# Patient Record
Sex: Female | Born: 1945 | Race: White | Hispanic: No | Smoking: Never smoker
Health system: Southern US, Community
[De-identification: ages and names within clinical notes are randomized; demographics above are authoritative.]

## PROBLEM LIST (undated history)

## (undated) DIAGNOSIS — I10 Essential (primary) hypertension: Secondary | ICD-10-CM

## (undated) HISTORY — PX: CATARACT EXTRACTION, BILATERAL: SHX1313

## (undated) HISTORY — PX: CHOLECYSTECTOMY: SHX55

## (undated) HISTORY — PX: TONSILLECTOMY: SUR1361

## (undated) HISTORY — PX: ROTATOR CUFF REPAIR: SHX139

---

## 2001-01-17 ENCOUNTER — Other Ambulatory Visit: Admission: RE | Admit: 2001-01-17 | Discharge: 2001-01-17 | Payer: Self-pay | Admitting: *Deleted

## 2001-10-27 ENCOUNTER — Ambulatory Visit (HOSPITAL_COMMUNITY): Admission: RE | Admit: 2001-10-27 | Discharge: 2001-10-27 | Payer: Self-pay | Admitting: *Deleted

## 2002-01-25 ENCOUNTER — Other Ambulatory Visit: Admission: RE | Admit: 2002-01-25 | Discharge: 2002-01-25 | Payer: Self-pay | Admitting: *Deleted

## 2007-08-23 ENCOUNTER — Ambulatory Visit: Payer: Self-pay | Admitting: Family Medicine

## 2010-07-09 ENCOUNTER — Ambulatory Visit: Payer: Self-pay | Admitting: Specialist

## 2010-07-23 ENCOUNTER — Ambulatory Visit: Payer: Self-pay | Admitting: Specialist

## 2010-07-30 ENCOUNTER — Ambulatory Visit: Payer: Self-pay | Admitting: Specialist

## 2010-11-05 ENCOUNTER — Ambulatory Visit: Payer: Self-pay | Admitting: Family Medicine

## 2011-03-26 ENCOUNTER — Ambulatory Visit: Payer: Self-pay | Admitting: Otolaryngology

## 2015-09-23 ENCOUNTER — Other Ambulatory Visit: Payer: Self-pay

## 2015-09-23 ENCOUNTER — Telehealth: Payer: Self-pay | Admitting: Gastroenterology

## 2015-09-23 NOTE — Telephone Encounter (Signed)
-----   Message from Armandina Gemma sent at 09/17/2015  1:59 PM EDT ----- Colon triage

## 2015-09-23 NOTE — Telephone Encounter (Addendum)
Gastroenterology Pre-Procedure Review  Request Date: 10-20 Requesting Physician: Dr.   PATIENT REVIEW QUESTIONS: The patient responded to the following health history questions as indicated:    1. Are you having any GI issues? no 2. Do you have a personal history of Polyps? yes ( ) 3. Do you have a family history of Colon Cancer or Polyps? no 4. Diabetes Mellitus? no 5. Joint replacements in the past 12 months?no 6. Major health problems in the past 3 months?no 7. Any artificial heart valves, MVP, or defibrillator?no    MEDICATIONS & ALLERGIES:    Patient reports the following regarding taking any anticoagulation/antiplatelet therapy:   Plavix, Coumadin, Eliquis, Xarelto, Lovenox, Pradaxa, Brilinta, or Effient? no Aspirin? no  Patient confirms/reports the following medications:  Amlodipine Estradiol Progesterone  No current outpatient prescriptions on file.   No current facility-administered medications for this visit.    Patient confirms/reports the following allergies:  Allergies not on file  No orders of the defined types were placed in this encounter.    AUTHORIZATION INFORMATION Primary Insurance: 1D#: Group #:  Secondary Insurance: 1D#: Group #:  SCHEDULE INFORMATION: Date: 10-20 Time: Location:

## 2015-09-24 ENCOUNTER — Telehealth: Payer: Self-pay

## 2015-09-24 NOTE — Telephone Encounter (Signed)
Pt scheduled for a colonoscopy on 10/17/15. Instructs/rx mailed.

## 2015-09-24 NOTE — Telephone Encounter (Signed)
-----   Message from Michelle L Edmonds sent at 09/17/2015  1:59 PM EDT ----- °Colon triage  °

## 2015-10-09 ENCOUNTER — Encounter: Payer: Self-pay | Admitting: *Deleted

## 2015-10-10 ENCOUNTER — Ambulatory Visit
Admission: RE | Admit: 2015-10-10 | Discharge: 2015-10-10 | Disposition: A | Discharge status: Home / Self Care | Payer: Medicare Other | Source: Ambulatory Visit | Attending: Family Medicine | Admitting: Family Medicine

## 2015-10-10 ENCOUNTER — Other Ambulatory Visit: Payer: Self-pay | Admitting: Family Medicine

## 2015-10-10 DIAGNOSIS — R05 Cough: Secondary | ICD-10-CM

## 2015-10-10 DIAGNOSIS — R059 Cough, unspecified: Secondary | ICD-10-CM

## 2015-10-17 ENCOUNTER — Ambulatory Visit: Payer: Medicare Other | Admitting: Anesthesiology

## 2015-10-17 ENCOUNTER — Other Ambulatory Visit: Payer: Self-pay | Admitting: Gastroenterology

## 2015-10-17 ENCOUNTER — Ambulatory Visit
Admission: RE | Admit: 2015-10-17 | Discharge: 2015-10-17 | Disposition: A | Discharge status: Home / Self Care | Payer: Medicare Other | Source: Ambulatory Visit | Attending: Gastroenterology | Admitting: Gastroenterology

## 2015-10-17 ENCOUNTER — Encounter
Admission: RE | Disposition: A | Discharge status: Home / Self Care | Payer: Self-pay | Source: Ambulatory Visit | Attending: Gastroenterology

## 2015-10-17 DIAGNOSIS — I10 Essential (primary) hypertension: Secondary | ICD-10-CM | POA: Diagnosis not present

## 2015-10-17 DIAGNOSIS — Z880 Allergy status to penicillin: Secondary | ICD-10-CM | POA: Diagnosis not present

## 2015-10-17 DIAGNOSIS — K635 Polyp of colon: Secondary | ICD-10-CM | POA: Insufficient documentation

## 2015-10-17 DIAGNOSIS — Z882 Allergy status to sulfonamides status: Secondary | ICD-10-CM | POA: Diagnosis not present

## 2015-10-17 DIAGNOSIS — Z860100 Personal history of colon polyps, unspecified: Secondary | ICD-10-CM | POA: Insufficient documentation

## 2015-10-17 DIAGNOSIS — K640 First degree hemorrhoids: Secondary | ICD-10-CM | POA: Insufficient documentation

## 2015-10-17 DIAGNOSIS — Z1211 Encounter for screening for malignant neoplasm of colon: Secondary | ICD-10-CM | POA: Diagnosis present

## 2015-10-17 DIAGNOSIS — K5730 Diverticulosis of large intestine without perforation or abscess without bleeding: Secondary | ICD-10-CM | POA: Insufficient documentation

## 2015-10-17 HISTORY — PX: COLONOSCOPY WITH PROPOFOL: SHX5780

## 2015-10-17 HISTORY — DX: Essential (primary) hypertension: I10

## 2015-10-17 HISTORY — PX: POLYPECTOMY: SHX5525

## 2015-10-17 SURGERY — COLONOSCOPY WITH PROPOFOL
Anesthesia: Monitor Anesthesia Care | Wound class: Contaminated

## 2015-10-17 MED ORDER — ACETAMINOPHEN 160 MG/5ML PO SOLN: 325.0000 mg | ORAL | Status: DC | PRN

## 2015-10-17 MED ORDER — LIDOCAINE HCL (CARDIAC) 20 MG/ML IV SOLN
INTRAVENOUS | Status: DC | PRN
  Administered 2015-10-17: 30 mg via INTRAVENOUS

## 2015-10-17 MED ORDER — OXYCODONE HCL 5 MG/5ML PO SOLN: 5.0000 mg | Freq: Once | ORAL | Status: DC | PRN

## 2015-10-17 MED ORDER — OXYCODONE HCL 5 MG PO TABS: 5.0000 mg | ORAL_TABLET | Freq: Once | ORAL | Status: DC | PRN

## 2015-10-17 MED ORDER — DEXAMETHASONE SODIUM PHOSPHATE 4 MG/ML IJ SOLN: 8.0000 mg | Freq: Once | INTRAMUSCULAR | Status: DC | PRN

## 2015-10-17 MED ORDER — LACTATED RINGERS IV SOLN
INTRAVENOUS | Status: DC
  Administered 2015-10-17: 08:00:00 via INTRAVENOUS

## 2015-10-17 MED ORDER — FENTANYL CITRATE (PF) 100 MCG/2ML IJ SOLN
25.0000 ug | INTRAMUSCULAR | Status: DC | PRN
Start: 2015-10-17 — End: 2015-10-17

## 2015-10-17 MED ORDER — ACETAMINOPHEN 325 MG PO TABS: 325.0000 mg | ORAL_TABLET | ORAL | Status: DC | PRN

## 2015-10-17 MED ORDER — LACTATED RINGERS IV SOLN: 500.0000 mL | INTRAVENOUS | Status: DC

## 2015-10-17 MED ORDER — PROPOFOL 10 MG/ML IV BOLUS
INTRAVENOUS | Status: DC | PRN
  Administered 2015-10-17 (×2): 50 mg via INTRAVENOUS
  Administered 2015-10-17: 40 mg via INTRAVENOUS
  Administered 2015-10-17: 100 mg via INTRAVENOUS

## 2015-10-17 MED ORDER — STERILE WATER FOR IRRIGATION IR SOLN
Status: DC | PRN
  Administered 2015-10-17: 09:00:00

## 2015-10-17 SURGICAL SUPPLY — 28 items
CANISTER SUCT 1200ML W/VALVE (MISCELLANEOUS) ×4 IMPLANT
FCP ESCP3.2XJMB 240X2.8X (MISCELLANEOUS)
FORCEPS BIOP RAD 4 LRG CAP 4 (CUTTING FORCEPS) ×4 IMPLANT
FORCEPS BIOP RJ4 240 W/NDL (MISCELLANEOUS)
FORCEPS ESCP3.2XJMB 240X2.8X (MISCELLANEOUS) IMPLANT
GOWN CVR UNV OPN BCK APRN NK (MISCELLANEOUS) ×4 IMPLANT
GOWN ISOL THUMB LOOP REG UNIV (MISCELLANEOUS) ×4
HEMOCLIP INSTINCT (CLIP) IMPLANT
INJECTOR VARIJECT VIN23 (MISCELLANEOUS) IMPLANT
KIT CO2 TUBING (TUBING) IMPLANT
KIT DEFENDO VALVE AND CONN (KITS) IMPLANT
KIT ENDO PROCEDURE OLY (KITS) ×4 IMPLANT
LIGATOR MULTIBAND 6SHOOTER MBL (MISCELLANEOUS) IMPLANT
MARKER SPOT ENDO TATTOO 5ML (MISCELLANEOUS) IMPLANT
PAD GROUND ADULT SPLIT (MISCELLANEOUS) IMPLANT
SNARE SHORT THROW 13M SML OVAL (MISCELLANEOUS) IMPLANT
SNARE SHORT THROW 30M LRG OVAL (MISCELLANEOUS) IMPLANT
SPOT EX ENDOSCOPIC TATTOO (MISCELLANEOUS)
SUCTION POLY TRAP 4CHAMBER (MISCELLANEOUS) IMPLANT
TRAP SUCTION POLY (MISCELLANEOUS) IMPLANT
TUBING CONN 6MMX3.1M (TUBING)
TUBING SUCTION CONN 0.25 STRL (TUBING) IMPLANT
UNDERPAD 30X60 958B10 (PK) (MISCELLANEOUS) IMPLANT
VALVE BIOPSY ENDO (VALVE) IMPLANT
VARIJECT INJECTOR VIN23 (MISCELLANEOUS)
WATER AUXILLARY (MISCELLANEOUS) IMPLANT
WATER STERILE IRR 250ML POUR (IV SOLUTION) ×4 IMPLANT
WATER STERILE IRR 500ML POUR (IV SOLUTION) IMPLANT

## 2015-10-17 NOTE — Op Note (Signed)
[facility] Gastroenterology Patient Name: Brianna [name] Procedure Date: 10/17/2015 8:58 AM MRN: [id number] Account #: [id number] Date of Birth: 08/17/1946 Admit Type: Outpatient Age: 69 Room: [facility] OR ROOM 01 Gender: Female Note Status: Finalized Procedure:         Colonoscopy Indications:       Screening for colorectal malignant neoplasm Providers:         Midge Minium, MD Referring MD:      Temple Pacini, MD (Referring MD) Medicines:         Propofol per Anesthesia Complications:     No immediate complications. Procedure:         Pre-Anesthesia Assessment:                    - Prior to the procedure, a History and Physical was                     performed, and patient medications and allergies were                     reviewed. The patient's tolerance of previous anesthesia                     was also reviewed. The risks and benefits of the procedure                     and the sedation options and risks were discussed with the                     patient. All questions were answered, and informed consent                     was obtained. Prior Anticoagulants: The patient has taken                     no previous anticoagulant or antiplatelet agents. ASA                     Grade Assessment: II - A patient with mild systemic                     disease. After reviewing the risks and benefits, the                     patient was deemed in satisfactory condition to undergo                     the procedure.                    After obtaining informed consent, the colonoscope was                     passed under direct vision. Throughout the procedure, the                     patient's blood pressure, pulse, and oxygen saturations                     were monitored continuously. The Olympus CF H180AL                     colonoscope (S#: [id number]) was introduced through the anus  and advanced to the the cecum, identified by appendiceal             orifice and ileocecal valve. The colonoscopy was performed                     without difficulty. The patient tolerated the procedure                     well. The quality of the bowel preparation was excellent. Findings:      The perianal and digital rectal examinations were normal.      A 3 mm polyp was found in the cecum. The polyp was sessile. The polyp       was removed with a cold biopsy forceps. Resection and retrieval were       complete.      Non-bleeding internal hemorrhoids were found during retroflexion. The       hemorrhoids were Grade I (internal hemorrhoids that do not prolapse).      A few small-mouthed diverticula were found in the sigmoid colon. Impression:        - One 3 mm polyp in the cecum. Resected and retrieved.                    - Non-bleeding internal hemorrhoids.                    - Diverticulosis in the sigmoid colon. Recommendation:    - Await pathology results.                    - Repeat colonoscopy in 5 years if polyp adenoma and 10                     years if hyperplastic Procedure Code(s): --- Professional ---                    [phone] [id number], Colonoscopy, flexible; with biopsy, single or                     multiple Diagnosis Code(s): --- Professional ---                    Z12.11, Encounter for screening for malignant neoplasm of                     colon                    D12.0, Benign neoplasm of cecum CPT copyright 2014 American Medical Association. All rights reserved. The codes documented in this report are preliminary and upon coder review may  be revised to meet current compliance requirements. Midge Miniumarren Tamina Cyphers, MD 10/17/2015 9:20:47 AM This report has been signed electronically. Number of Addenda: 0 Note Initiated On: 10/17/2015 8:58 AM Scope Withdrawal Time: 0 hours 6 minutes 56 seconds  Total Procedure Duration: 0 hours 11 minutes 4 seconds       [facility]

## 2015-10-17 NOTE — Anesthesia Preprocedure Evaluation (Signed)
Anesthesia Evaluation  Patient identified by MRN, date of birth, ID band Patient awake    Reviewed: Allergy & Precautions, H&P , NPO status , Patient's Chart, lab work & pertinent test results, reviewed documented beta blocker date and time   Airway Mallampati: II  TM Distance: >3 FB Neck ROM: full    Dental no notable dental hx.    Pulmonary neg pulmonary ROS,    Pulmonary exam normal breath sounds clear to auscultation       Cardiovascular Exercise Tolerance: Good hypertension, negative cardio ROS   Rhythm:regular Rate:Normal     Neuro/Psych negative neurological ROS  negative psych ROS   GI/Hepatic negative GI ROS, Neg liver ROS,   Endo/Other  negative endocrine ROS  Renal/GU negative Renal ROS  negative genitourinary   Musculoskeletal   Abdominal   Peds  Hematology negative hematology ROS (+)   Anesthesia Other Findings   Reproductive/Obstetrics negative OB ROS                             Anesthesia Physical Anesthesia Plan  ASA: II  Anesthesia Plan: MAC   Post-op Pain Management:    Induction:   Airway Management Planned:   Additional Equipment:   Intra-op Plan:   Post-operative Plan:   Informed Consent: I have reviewed the patients History and Physical, chart, labs and discussed the procedure including the risks, benefits and alternatives for the proposed anesthesia with the patient or authorized representative who has indicated his/her understanding and acceptance.     Plan Discussed with: CRNA  Anesthesia Plan Comments:         Anesthesia Quick Evaluation

## 2015-10-17 NOTE — Transfer of Care (Signed)
Immediate Anesthesia Transfer of Care Note  Patient: Brianna [name]  Procedure(s) Performed: Procedure(s) with comments: COLONOSCOPY WITH PROPOFOL (N/A) POLYPECTOMY - cecum polyp  Patient Location: PACU  Anesthesia Type: MAC  Level of Consciousness: awake, alert  and patient cooperative  Airway and Oxygen Therapy: Patient Spontanous Breathing and Patient connected to supplemental oxygen  Post-op Assessment: Post-op Vital signs reviewed, Patient's Cardiovascular Status Stable, Respiratory Function Stable, Patent Airway and No signs of Nausea or vomiting  Post-op Vital Signs: Reviewed and stable  Complications: No apparent anesthesia complications

## 2015-10-17 NOTE — Discharge Instructions (Signed)

## 2015-10-17 NOTE — Anesthesia Procedure Notes (Signed)
Procedure Name: MAC Performed by: Savio Albrecht Pre-anesthesia Checklist: Patient identified, Emergency Drugs available, Suction available, Patient being monitored and Timeout performed Patient Re-evaluated:Patient Re-evaluated prior to inductionOxygen Delivery Method: Nasal cannula       

## 2015-10-17 NOTE — Anesthesia Postprocedure Evaluation (Signed)
  Anesthesia Post-op Note  Patient: Brianna CraneFrances D Seat  Procedure(s) Performed: Procedure(s) with comments: COLONOSCOPY WITH PROPOFOL (N/A) POLYPECTOMY - cecum polyp  Anesthesia type:MAC  Patient location: PACU  Post pain: Pain level controlled  Post assessment: Post-op Vital signs reviewed, Patient's Cardiovascular Status Stable, Respiratory Function Stable, Patent Airway and No signs of Nausea or vomiting  Post vital signs: Reviewed and stable  Last Vitals:  Filed Vitals:   10/17/15 0922  BP:   Pulse: 68  Temp: 36.5 C  Resp: 21    Level of consciousness: awake, alert  and patient cooperative  Complications: No apparent anesthesia complications

## 2015-10-17 NOTE — H&P (Signed)
  [facility]  [address] [place], [place] [address] Phone: [phone] Fax : [phone]  Primary Care Physician:  Dortha KernBLISS, LAURA K, MD Primary Gastroenterologist:  Dr. Servando SnareWohl  Pre-Procedure History & Physical: HPI:  Brianna [name] is a 69 y.o. female is here for an colonoscopy.   Past Medical History  Diagnosis Date  . Hypertension     Past Surgical History  Procedure Laterality Date  . Tonsillectomy    . Cholecystectomy    . Rotator cuff repair Right     Prior to Admission medications   Medication Sig Start Date End Date Taking? Authorizing Provider  amLODipine (NORVASC) 5 MG tablet Take 5 mg by mouth daily. AM   Yes Historical Provider, MD  estradiol (ESTRACE) 0.5 MG tablet Take 0.5 mg by mouth daily. AM   Yes Historical Provider, MD  progesterone (PROMETRIUM) 100 MG capsule Take 100 mg by mouth daily. AM   Yes Historical Provider, MD    Allergies as of 09/23/2015  . (Not on File)    History reviewed. No pertinent family history.  Social History   Social History  . Marital Status: Widowed    Spouse Name: N/A  . Number of Children: N/A  . Years of Education: N/A   Occupational History  . Not on file.   Social History Main Topics  . Smoking status: Never Smoker   . Smokeless tobacco: Not on file  . Alcohol Use: No  . Drug Use: Not on file  . Sexual Activity: Not on file   Other Topics Concern  . Not on file   Social History Narrative  . No narrative on file    Review of Systems: See HPI, otherwise negative ROS  Physical Exam: Ht 5\' 4"  (1.626 m)  Wt 174 lb (78.926 kg)  BMI 29.85 kg/m2 General:   Alert,  pleasant and cooperative in NAD Head:  Normocephalic and atraumatic. Neck:  Supple; no masses or thyromegaly. Lungs:  Clear throughout to auscultation.    Heart:  Regular rate and rhythm. Abdomen:  Soft, nontender and nondistended. Normal bowel sounds, without guarding, and without rebound.   Neurologic:  Alert and   oriented x4;  grossly normal neurologically.  Impression/Plan: Brianna [name] is here for an colonoscopy to be performed for history of colon polyps  Risks, benefits, limitations, and alternatives regarding  colonoscopy have been reviewed with the patient.  Questions have been answered.  All parties agreeable.   Darlina Rumpfaren Romolo Sieling, MD  10/17/2015, 7:59 AM

## 2015-10-18 ENCOUNTER — Encounter: Payer: Self-pay | Admitting: Gastroenterology

## 2015-10-29 ENCOUNTER — Encounter: Payer: Self-pay | Admitting: Gastroenterology

## 2015-12-06 DIAGNOSIS — M7540 Impingement syndrome of unspecified shoulder: Secondary | ICD-10-CM | POA: Insufficient documentation

## 2015-12-25 ENCOUNTER — Other Ambulatory Visit: Payer: Self-pay | Admitting: Specialist

## 2015-12-25 DIAGNOSIS — M7542 Impingement syndrome of left shoulder: Secondary | ICD-10-CM

## 2016-01-14 ENCOUNTER — Ambulatory Visit
Admission: RE | Admit: 2016-01-14 | Discharge: 2016-01-14 | Disposition: A | Discharge status: Home / Self Care | Payer: Medicare Other | Source: Ambulatory Visit | Attending: Specialist | Admitting: Specialist

## 2016-01-14 DIAGNOSIS — M75122 Complete rotator cuff tear or rupture of left shoulder, not specified as traumatic: Secondary | ICD-10-CM | POA: Insufficient documentation

## 2016-01-14 DIAGNOSIS — M7542 Impingement syndrome of left shoulder: Secondary | ICD-10-CM | POA: Diagnosis not present

## 2016-01-14 DIAGNOSIS — M67814 Other specified disorders of tendon, left shoulder: Secondary | ICD-10-CM | POA: Diagnosis not present

## 2016-01-16 DIAGNOSIS — M75120 Complete rotator cuff tear or rupture of unspecified shoulder, not specified as traumatic: Secondary | ICD-10-CM | POA: Insufficient documentation

## 2016-02-06 ENCOUNTER — Other Ambulatory Visit: Payer: Medicare Other

## 2016-02-12 ENCOUNTER — Ambulatory Visit: Admission: RE | Admit: 2016-02-12 | Payer: Medicare Other | Source: Ambulatory Visit | Admitting: Specialist

## 2016-02-12 ENCOUNTER — Encounter: Admission: RE | Payer: Self-pay | Source: Ambulatory Visit

## 2016-02-12 SURGERY — ARTHROSCOPY, SHOULDER WITH REPAIR, ROTATOR CUFF, OPEN
Anesthesia: Choice | Laterality: Left

## 2016-02-20 ENCOUNTER — Other Ambulatory Visit: Payer: Self-pay | Admitting: Specialist

## 2016-03-05 ENCOUNTER — Other Ambulatory Visit: Payer: Self-pay | Admitting: Specialist

## 2016-03-09 ENCOUNTER — Encounter
Admission: RE | Admit: 2016-03-09 | Discharge: 2016-03-09 | Disposition: A | Discharge status: Home / Self Care | Payer: Medicare Other | Source: Ambulatory Visit | Attending: Specialist | Admitting: Specialist

## 2016-03-09 DIAGNOSIS — Z01812 Encounter for preprocedural laboratory examination: Secondary | ICD-10-CM | POA: Insufficient documentation

## 2016-03-09 DIAGNOSIS — Z01810 Encounter for preprocedural cardiovascular examination: Secondary | ICD-10-CM | POA: Insufficient documentation

## 2016-03-09 LAB — CBC
HEMATOCRIT: 41.8 % (ref 35.0–47.0)
HEMOGLOBIN: 13.9 g/dL (ref 12.0–16.0)
MCH: 29.5 pg (ref 26.0–34.0)
MCHC: 33.1 g/dL (ref 32.0–36.0)
MCV: 88.9 fL (ref 80.0–100.0)
Platelets: 207 10*3/uL (ref 150–440)
RBC: 4.7 MIL/uL (ref 3.80–5.20)
RDW: 14.9 % — AB (ref 11.5–14.5)
WBC: 7.1 10*3/uL (ref 3.6–11.0)

## 2016-03-09 NOTE — Patient Instructions (Addendum)
Your procedure is scheduled on: Wednesday 03/18/16 Report to Day Surgery. 2ND FLOOR MEDICAL MALL ENTRANCE To find out your arrival time please call [phone] between 1PM - 3PM on Tuesday 03/17/16.  Remember: Instructions that are not followed completely may result in serious medical risk, up to and including death, or upon the discretion of your surgeon and anesthesiologist your surgery may need to be rescheduled.    __X__ 1. Do not eat food or drink liquids after midnight. No gum chewing or hard candies.     __X__ 2. No Alcohol for 24 hours before or after surgery.   ____ 3. Bring all medications with you on the day of surgery if instructed.    __X__ 4. Notify your doctor if there is any change in your medical condition     (cold, fever, infections).     Do not wear jewelry, make-up, hairpins, clips or nail polish.  Do not wear lotions, powders, or perfumes.   Do not shave 48 hours prior to surgery. Men may shave face and neck.  Do not bring valuables to the hospital.    [facility] is not responsible for any belongings or valuables.               Contacts, dentures or bridgework may not be worn into surgery.  Leave your suitcase in the car. After surgery it may be brought to your room.  For patients admitted to the hospital, discharge time is determined by your                treatment team.   Patients discharged the day of surgery will not be allowed to drive home.   Please read over the following fact sheets that you were given:   Surgical Site Infection Prevention   __X__ Take these medicines the morning of surgery with A SIP OF WATER:    1. AMLODIPINE  2.   3.   4.  5.  6.  ____ Fleet Enema (as directed)   __X__ Use CHG Soap as directed  ____ Use inhalers on the day of surgery  ____ Stop metformin 2 days prior to surgery    ____ Take 1/2 of usual insulin dose the night before surgery and none on the morning of surgery.   ____ Stop Coumadin/Plavix/aspirin on    ____ Stop Anti-inflammatories on    ____ Stop supplements until after surgery.    ____ Bring C-Pap to the hospital.   Peripheral Nerve Blocks Your caregiver has placed a nerve block in one of your arms or legs to reduce pain and discomfort. The block lessens the amount of pain medicine you will need. Your caregiver will inject you in the arm or leg that was operated on. The injection is usually given away from the surgical site. The injection is a local anesthetic or a combination of local anesthetics. This injection provides numbing pain relief for up to 18 to 24 hours. There are few possible complications from this procedure. However, you should notify your caregiver if you have any problems. Be aware that you may lose feeling at and around the surgical area. If numbness happens, take proper measures to avoid injury. Do not stand up unassisted if you have a nerve block in your leg. Do not try to lift items if you have had a nerve block in your arm. Be careful when placing hot or cold items on a numb extremity. SEEK IMMEDIATE MEDICAL CARE IF:  You have redness, swelling, pain,  or discharge at the injection site.  You develop dizziness or lightheadedness.  You have blurred vision.  There is a ringing or buzzing in your ears.  You have a metal taste in your mouth.  You develop numbness or tingling around your mouth.  You develop drowsiness.  You develop confusion.   This information is not intended to replace advice given to you by your health care provider. Make sure you discuss any questions you have with your health care provider.   Document Released: 03/22/2008 Document Revised: 03/07/2012 Document Reviewed: 02/02/2011 Elsevier Interactive Patient Education [employer]2016 Elsevier Inc.

## 2016-03-18 ENCOUNTER — Ambulatory Visit
Admission: RE | Admit: 2016-03-18 | Discharge: 2016-03-18 | Disposition: A | Discharge status: Home / Self Care | Payer: Medicare Other | Source: Ambulatory Visit | Attending: Specialist | Admitting: Specialist

## 2016-03-18 ENCOUNTER — Ambulatory Visit: Payer: Medicare Other | Admitting: Anesthesiology

## 2016-03-18 ENCOUNTER — Encounter
Admission: RE | Disposition: A | Discharge status: Home / Self Care | Payer: Self-pay | Source: Ambulatory Visit | Attending: Specialist

## 2016-03-18 ENCOUNTER — Encounter: Payer: Self-pay | Admitting: Specialist

## 2016-03-18 DIAGNOSIS — M7542 Impingement syndrome of left shoulder: Secondary | ICD-10-CM | POA: Insufficient documentation

## 2016-03-18 DIAGNOSIS — S43422A Sprain of left rotator cuff capsule, initial encounter: Secondary | ICD-10-CM

## 2016-03-18 DIAGNOSIS — M75122 Complete rotator cuff tear or rupture of left shoulder, not specified as traumatic: Secondary | ICD-10-CM | POA: Insufficient documentation

## 2016-03-18 HISTORY — PX: SHOULDER ARTHROSCOPY WITH OPEN ROTATOR CUFF REPAIR: SHX6092

## 2016-03-18 SURGERY — ARTHROSCOPY, SHOULDER WITH REPAIR, ROTATOR CUFF, OPEN
Anesthesia: Regional | Site: Shoulder | Laterality: Left | Wound class: Clean

## 2016-03-18 MED ORDER — BUPIVACAINE-EPINEPHRINE (PF) 0.25% -1:200000 IJ SOLN
INTRAMUSCULAR | Status: DC | PRN
  Administered 2016-03-18: 25 mL via PERINEURAL
  Administered 2016-03-18: 30 mL via PERINEURAL

## 2016-03-18 MED ORDER — ONDANSETRON HCL 4 MG/2ML IJ SOLN
INTRAMUSCULAR | Status: DC | PRN
  Administered 2016-03-18: 4 mg via INTRAVENOUS

## 2016-03-18 MED ORDER — CEFAZOLIN SODIUM-DEXTROSE 2-4 GM/100ML-% IV SOLN
2.0000 g | Freq: Once | INTRAVENOUS | Status: DC
  Filled 2016-03-18: qty 100

## 2016-03-18 MED ORDER — SODIUM CHLORIDE 0.9 % IV SOLN
10000.0000 ug | INTRAVENOUS | Status: DC | PRN
  Administered 2016-03-18 (×3): 50 ug via INTRAVENOUS

## 2016-03-18 MED ORDER — FENTANYL CITRATE (PF) 100 MCG/2ML IJ SOLN: 25.0000 ug | INTRAMUSCULAR | Status: DC | PRN

## 2016-03-18 MED ORDER — ROPIVACAINE HCL 5 MG/ML IJ SOLN
INTRAMUSCULAR | Status: AC
  Filled 2016-03-18: qty 40

## 2016-03-18 MED ORDER — MELOXICAM 15 MG PO TABS: 15.0000 mg | ORAL_TABLET | Freq: Every day | ORAL | Status: DC

## 2016-03-18 MED ORDER — GLYCOPYRROLATE 0.2 MG/ML IJ SOLN
INTRAMUSCULAR | Status: DC | PRN
  Administered 2016-03-18: 0.6 mg via INTRAVENOUS

## 2016-03-18 MED ORDER — ONDANSETRON HCL 4 MG/2ML IJ SOLN: 4.0000 mg | Freq: Once | INTRAMUSCULAR | Status: DC | PRN

## 2016-03-18 MED ORDER — SUCCINYLCHOLINE CHLORIDE 20 MG/ML IJ SOLN
INTRAMUSCULAR | Status: DC | PRN
  Administered 2016-03-18: 100 mg via INTRAVENOUS

## 2016-03-18 MED ORDER — CEFAZOLIN SODIUM-DEXTROSE 2-3 GM-% IV SOLR
INTRAVENOUS | Status: DC | PRN
  Administered 2016-03-18: 2 g via INTRAVENOUS

## 2016-03-18 MED ORDER — LIDOCAINE HCL (CARDIAC) 20 MG/ML IV SOLN
INTRAVENOUS | Status: DC | PRN
  Administered 2016-03-18: 60 mg via INTRAVENOUS

## 2016-03-18 MED ORDER — MORPHINE SULFATE (PF) 4 MG/ML IV SOLN
INTRAVENOUS | Status: DC | PRN
  Administered 2016-03-18: 4 mg via INTRAVENOUS

## 2016-03-18 MED ORDER — MIDAZOLAM HCL 5 MG/5ML IJ SOLN
2.0000 mg | Freq: Once | INTRAMUSCULAR | Status: AC
  Administered 2016-03-18: 2 mg via INTRAVENOUS

## 2016-03-18 MED ORDER — LIDOCAINE HCL (PF) 1 % IJ SOLN
INTRAMUSCULAR | Status: AC
  Filled 2016-03-18: qty 5

## 2016-03-18 MED ORDER — CEFAZOLIN SODIUM 1 G IJ SOLR
2.0000 g | INTRAMUSCULAR | Status: DC
  Filled 2016-03-18: qty 20

## 2016-03-18 MED ORDER — MELOXICAM 7.5 MG PO TABS
15.0000 mg | ORAL_TABLET | Freq: Every day | ORAL | Status: DC
  Administered 2016-03-18: 15 mg via ORAL

## 2016-03-18 MED ORDER — OXYCODONE-ACETAMINOPHEN 10-325 MG PO TABS: 1.0000 | ORAL_TABLET | Freq: Four times a day (QID) | ORAL | Status: DC | PRN

## 2016-03-18 MED ORDER — LACTATED RINGERS IV SOLN
INTRAVENOUS | Status: DC
  Administered 2016-03-18 (×2): via INTRAVENOUS

## 2016-03-18 MED ORDER — ROCURONIUM BROMIDE 100 MG/10ML IV SOLN
INTRAVENOUS | Status: DC | PRN
  Administered 2016-03-18: 50 mg via INTRAVENOUS

## 2016-03-18 MED ORDER — METHOCARBAMOL 500 MG PO TABS: 500.0000 mg | ORAL_TABLET | Freq: Four times a day (QID) | ORAL | Status: DC

## 2016-03-18 MED ORDER — GABAPENTIN 400 MG PO CAPS
400.0000 mg | ORAL_CAPSULE | Freq: Once | ORAL | Status: AC
  Administered 2016-03-18: 400 mg via ORAL

## 2016-03-18 MED ORDER — GABAPENTIN 400 MG PO CAPS: 400.0000 mg | ORAL_CAPSULE | Freq: Three times a day (TID) | ORAL | Status: DC

## 2016-03-18 MED ORDER — PROPOFOL 10 MG/ML IV BOLUS
INTRAVENOUS | Status: DC | PRN
  Administered 2016-03-18: 140 mg via INTRAVENOUS

## 2016-03-18 MED ORDER — FENTANYL CITRATE (PF) 100 MCG/2ML IJ SOLN
INTRAMUSCULAR | Status: AC
  Administered 2016-03-18: 50 ug via INTRAVENOUS
  Filled 2016-03-18: qty 2

## 2016-03-18 MED ORDER — EPHEDRINE SULFATE 50 MG/ML IJ SOLN
INTRAMUSCULAR | Status: DC | PRN
  Administered 2016-03-18: 10 mg via INTRAVENOUS
  Administered 2016-03-18: 5 mg via INTRAVENOUS
  Administered 2016-03-18: 20 mg via INTRAVENOUS
  Administered 2016-03-18: 5 mg via INTRAVENOUS

## 2016-03-18 MED ORDER — EPINEPHRINE HCL 1 MG/ML IJ SOLN
INTRAMUSCULAR | Status: AC
  Filled 2016-03-18: qty 1

## 2016-03-18 MED ORDER — CLINDAMYCIN PHOSPHATE 600 MG/50ML IV SOLN
600.0000 mg | Freq: Three times a day (TID) | INTRAVENOUS | Status: DC
  Administered 2016-03-18: 600 mg via INTRAVENOUS

## 2016-03-18 MED ORDER — MIDAZOLAM HCL 5 MG/5ML IJ SOLN
INTRAMUSCULAR | Status: AC
  Administered 2016-03-18: 2 mg via INTRAVENOUS
  Filled 2016-03-18: qty 5

## 2016-03-18 MED ORDER — EPINEPHRINE HCL 1 MG/ML IJ SOLN
INTRAMUSCULAR | Status: DC | PRN
  Administered 2016-03-18: 9 mL

## 2016-03-18 MED ORDER — NEOSTIGMINE METHYLSULFATE 10 MG/10ML IV SOLN
INTRAVENOUS | Status: DC | PRN
  Administered 2016-03-18: 3 mg via INTRAVENOUS

## 2016-03-18 MED ORDER — BUPIVACAINE-EPINEPHRINE (PF) 0.25% -1:200000 IJ SOLN
INTRAMUSCULAR | Status: AC
  Filled 2016-03-18: qty 60

## 2016-03-18 MED ORDER — CEFAZOLIN SODIUM 1 G IJ SOLR
Freq: Once | INTRAMUSCULAR | Status: DC
  Filled 2016-03-18: qty 50

## 2016-03-18 MED ORDER — FAMOTIDINE 20 MG PO TABS
20.0000 mg | ORAL_TABLET | Freq: Once | ORAL | Status: AC
  Administered 2016-03-18: 20 mg via ORAL

## 2016-03-18 MED ORDER — NEOMYCIN-POLYMYXIN B GU 40-200000 IR SOLN
Status: AC
  Filled 2016-03-18: qty 2

## 2016-03-18 MED ORDER — FENTANYL CITRATE (PF) 100 MCG/2ML IJ SOLN
50.0000 ug | Freq: Once | INTRAMUSCULAR | Status: AC
  Administered 2016-03-18: 50 ug via INTRAVENOUS

## 2016-03-18 SURGICAL SUPPLY — 63 items
ADAPTER IRRIG TUBE 2 SPIKE SOL (ADAPTER) ×6 IMPLANT
BLADE AGGRESSIVE PLUS 4.0 (BLADE) ×6 IMPLANT
BLADE SURG SZ11 CARB STEEL (BLADE) ×3 IMPLANT
BUR AGGRESSIVE+ 5.5 (BURR) IMPLANT
BUR BR 5.5 12 FLUTE (BURR) IMPLANT
BUR RADIUS 4.0X18.5 (BURR) IMPLANT
BUR RADIUS 5.5 (BURR) ×3 IMPLANT
CANNULA 5.75X7 CRYSTAL CLEAR (CANNULA) ×3 IMPLANT
CANNULA 8.5X75 THRED (CANNULA) ×3 IMPLANT
CANNULA PARTIAL THREAD 2X7 (CANNULA) ×3 IMPLANT
CHLORAPREP W/TINT 26ML (MISCELLANEOUS) ×3 IMPLANT
CONNECTOR PERFECT PASSER (CONNECTOR) ×3 IMPLANT
COVER MAYO STAND STRL (DRAPES) ×3 IMPLANT
DRAPE IMP U-DRAPE 54X76 (DRAPES) ×3 IMPLANT
DRAPE SHEET LG 3/4 BI-LAMINATE (DRAPES) ×3 IMPLANT
DRAPE STERI 35X30 U-POUCH (DRAPES) ×3 IMPLANT
GAUZE PETRO XEROFOAM 1X8 (MISCELLANEOUS) ×6 IMPLANT
GAUZE SPONGE 4X4 12PLY STRL (GAUZE/BANDAGES/DRESSINGS) ×3 IMPLANT
GLOVE SURG ORTHO 8.0 STRL STRW (GLOVE) ×3 IMPLANT
GOWN STRL REUS W/ TWL LRG LVL4 (GOWN DISPOSABLE) ×1 IMPLANT
GOWN STRL REUS W/TWL LRG LVL4 (GOWN DISPOSABLE) ×2
IV LACTATED RINGER IRRG 3000ML (IV SOLUTION) ×18
IV LR IRRIG 3000ML ARTHROMATIC (IV SOLUTION) ×9 IMPLANT
KIT RM TURNOVER STRD PROC AR (KITS) ×3 IMPLANT
KIT SHOULDER TRACTION (DRAPES) ×3 IMPLANT
MANIFOLD NEPTUNE II (INSTRUMENTS) ×3 IMPLANT
MAT BLUE FLOOR 46X72 FLO (MISCELLANEOUS) ×3 IMPLANT
NDL MAYO CATGUT SZ5 (NEEDLE) ×2
NDL SAFETY 18GX1.5 (NEEDLE) ×3 IMPLANT
NDL SUT 5 .5 CRC TPR PNT MAYO (NEEDLE) ×1 IMPLANT
NEEDLE SPNL 18GX3.5 QUINCKE PK (NEEDLE) ×3 IMPLANT
NS IRRIG 500ML POUR BTL (IV SOLUTION) ×3 IMPLANT
PACK ARTHROSCOPY SHOULDER (MISCELLANEOUS) ×3 IMPLANT
PASSER SUT CAPTURE FIRST (SUTURE) ×6 IMPLANT
SET TUBE SUCT SHAVER OUTFL 24K (TUBING) ×3 IMPLANT
SET TUBE TIP INTRA-ARTICULAR (MISCELLANEOUS) ×3 IMPLANT
SLING ULTRA II LG (MISCELLANEOUS) ×6 IMPLANT
SOL PREP PVP 2OZ (MISCELLANEOUS) ×3
SOLUTION PREP PVP 2OZ (MISCELLANEOUS) ×1 IMPLANT
SPONGE VERSALON 4X4 4PLY (MISCELLANEOUS) ×3 IMPLANT
STAPLER SKIN PROX 35W (STAPLE) ×3 IMPLANT
SUT ETH BLK MONO 3 0 FS 1 12/B (SUTURE) ×3 IMPLANT
SUT ETHIBOND 2-0  6-8 CP-2 (SUTURE) ×2
SUT ETHIBOND 2-0 6-8 CP-2 (SUTURE) ×1 IMPLANT
SUT ETHILON 3 0 FSLX (SUTURE) ×3 IMPLANT
SUT ORTHOCORD W/MULTIPK NDL (SUTURE) ×6 IMPLANT
SUT PDS PLUS 0 (SUTURE) ×2
SUT PDS PLUS AB 0 CT-2 (SUTURE) ×1 IMPLANT
SUT PERFECTPASSER WHITE CART (SUTURE) ×3 IMPLANT
SUT SMART STITCH CARTRIDGE (SUTURE) ×3 IMPLANT
SUT VIC AB 0 CT2 27 (SUTURE) ×3 IMPLANT
SUT VIC AB 2-0 CT2 27 (SUTURE) ×6 IMPLANT
SUT VIC AB 4-0 SH 27 (SUTURE) ×2
SUT VIC AB 4-0 SH 27XANBCTRL (SUTURE) ×1 IMPLANT
SUT VICRYL 3-0 27IN (SUTURE) ×3 IMPLANT
SUTURE MAGNUM WIRE 2X48 BLK (SUTURE) ×9 IMPLANT
SYR 20CC LL (SYRINGE) ×3 IMPLANT
SYR 30ML LL (SYRINGE) ×3 IMPLANT
SYR 50ML LL SCALE MARK (SYRINGE) ×3 IMPLANT
TUBING ARTHRO INFLOW-ONLY STRL (TUBING) ×3 IMPLANT
TUBING CONNECTING 10 (TUBING) ×2 IMPLANT
TUBING CONNECTING 10' (TUBING) ×1
WAND HAND CNTRL MULTIVAC 90 (MISCELLANEOUS) ×9 IMPLANT

## 2016-03-18 NOTE — Op Note (Signed)
03/18/2016  1:24 PM  PATIENT:  [place] CraneFrances D Mehaffey    PRE-OPERATIVE DIAGNOSIS:  [id number] Complete rotatr-cuff tear/ruptr of left shoulder, not trauma. Impingement syncrome.  AC arthrtis  POST-OPERATIVE DIAGNOSIS:  Same  PROCEDURE:  SHOULDER ARTHROSCOPY WITH OPEN ROTATOR CUFF REPAIR.  Subacromial decompression.  Distal clavicle excision  SURGEON:  Valinda HoarMILLER,Hildreth Robart E, MD   .  ANESTHESIA:   General plus interscalene block  PREOPERATIVE INDICATIONS:  [place] CraneFrances D Bullen is a  [age] y.o. female with a diagnosis of M75.122 Complete rotatr-cuff tear/ruptr of left shoulder, not trauma who failed conservative measures and elected for surgical management.    The risks benefits and alternatives were discussed with the patient preoperatively including but not limited to the risks of infection, bleeding, nerve injury, cardiopulmonary complications, the need for revision surgery, among others, and the patient was willing to proceed.  OPERATIVE IMPLANTS: None  OPERATIVE FINDINGS: The patient had severe subacromial bursitis and impingement. The anterior acromion was prominent. The ACjoint was arthritic. The glenohumeral joint was intact.  The biceps tendon was normal.  The rotator cuff was torn from anterior to the biceps tendon directly posteriorly. There was a large amount of tissue remaining laterally.  There was no place to put anchors laterally.    OPERATIVE PROCEDURE: The patient was brought to the operating room where satisfactory general endotracheal and interscalene anesthesia were accomplished.The patient was turned into the lateral decubitus position and the shoulder was prepped and draped in a sterile fashion. Arthroscopy was carried out from a posterior portal with accessory portals laterally and anteriorly. The  joint was examined first. The above findings were encountered. The motorized shaver was introduced anteriorly and the undersurface of the rotator cuff probed and lightly debrided. The biceps  tendon was normal and was left alone.    The labrum was trimmed up with the ArthroCare wand 2. The arthroscope was redirected into the subacromial space. There was severe bursitis which was resected with the motorized shaver and ArthroCare wand. The large bur was introduced from a posterior portal and the anterior acromion was debrided. The undersurface of the clavicle was debrided with the bur which was then reintroduced from an anterior portal and the remaining distal clavicle completely excised. Once debridement was finished, the cuff was repaired side to side with magnum wire and orthocord sutures. The most posterior ones were put in arthroscopically, but a small incision was made to expose the anterior cuff and 2 final sutures were placed under direct vision.  Final debridement was carried out with the motorized shaver and the ArthroCare wand. The joint was flushed and the stab wounds and closed with staples. The incision was closed with 0 vicryl and 2-0 vicryl and staples.  The scope was re-introduced and the repair examined and was felt to be solid.  Sponge and needle counts were correct.   The dry sterile dressing and was applied along with a padded sling. Patient was awakened and taken recovery in good condition. TENS pads were applied.   Tenna ChildHoward E Tylyn Derwin M.D.

## 2016-03-18 NOTE — H&P (Signed)
THE PATIENT WAS SEEN PRIOR TO SURGERY TODAY.  HISTORY, ALLERGIES, HOME MEDICATIONS AND OPERATIVE PROCEDURE WERE REVIEWED. RISKS AND BENEFITS OF SURGERY DISCUSSED WITH PATIENT AGAIN.  NO CHANGES FROM INITIAL HISTORY AND PHYSICAL NOTED.    

## 2016-03-18 NOTE — Anesthesia Preprocedure Evaluation (Signed)
Anesthesia Evaluation  Patient identified by MRN, date of birth, ID band Patient awake    Reviewed: Allergy & Precautions, NPO status , Patient's Chart, lab work & pertinent test results  History of Anesthesia Complications Negative for: history of anesthetic complications  Airway Mallampati: II       Dental   Pulmonary neg pulmonary ROS,           Cardiovascular hypertension, Pt. on medications      Neuro/Psych negative neurological ROS  negative psych ROS   GI/Hepatic negative GI ROS, Neg liver ROS,   Endo/Other  negative endocrine ROS  Renal/GU negative Renal ROS     Musculoskeletal   Abdominal   Peds  Hematology negative hematology ROS (+)   Anesthesia Other Findings   Reproductive/Obstetrics                             Anesthesia Physical Anesthesia Plan  ASA: II  Anesthesia Plan: General and Regional   Post-op Pain Management:    Induction: Intravenous  Airway Management Planned: Oral ETT  Additional Equipment:   Intra-op Plan:   Post-operative Plan:   Informed Consent: I have reviewed the patients History and Physical, chart, labs and discussed the procedure including the risks, benefits and alternatives for the proposed anesthesia with the patient or authorized representative who has indicated his/her understanding and acceptance.     Plan Discussed with:   Anesthesia Plan Comments:         Anesthesia Quick Evaluation

## 2016-03-18 NOTE — Transfer of Care (Signed)
Immediate Anesthesia Transfer of Care Note  Patient: Brianna [name]  Procedure(s) Performed: Procedure(s): SHOULDER ARTHROSCOPY WITH OPEN ROTATOR CUFF REPAIR (Left)  Patient Location: PACU  Anesthesia Type:General  Level of Consciousness: awake and alert   Airway & Oxygen Therapy: Patient Spontanous Breathing and Patient connected to face mask oxygen  Post-op Assessment: Report given to RN  Post vital signs: Reviewed and stable  Last Vitals:  Filed Vitals:   03/18/16 1042 03/18/16 1341  BP:  136/75  Pulse: 88 122  Temp:  36.6 C  Resp: 18     Complications: No apparent anesthesia complications

## 2016-03-18 NOTE — Anesthesia Procedure Notes (Addendum)
Procedure Name: Intubation Date/Time: 03/18/2016 10:56 AM Performed by: ZOXWRUEKILDUFF, DAVID Pre-anesthesia Checklist: Patient identified, Timeout performed, Patient being monitored, Suction available and Emergency Drugs available Patient Re-evaluated:Patient Re-evaluated prior to inductionOxygen Delivery Method: Circle system utilized Preoxygenation: Pre-oxygenation with 100% oxygen Intubation Type: IV induction Ventilation: Mask ventilation without difficulty Grade View: Grade II Tube type: Oral Tube size: 7.0 mm Number of attempts: 1 Placement Confirmation: ETT inserted through vocal cords under direct vision,  positive ETCO2,  CO2 detector and breath sounds checked- equal and bilateral Secured at: 22 cm Tube secured with: Tape   Anesthesia Regional Block:  Interscalene brachial plexus block  Pre-Anesthetic Checklist: ,, timeout performed, Correct Patient, Correct Site, Correct Laterality, Correct Procedure, Correct Position, site marked, Risks and benefits discussed,  Surgical consent,  Pre-op evaluation,  At surgeon's request and post-op pain management  Laterality: Left  Prep: chloraprep       Needles:  Injection technique: Single-shot  Needle Type: Echogenic Needle     Needle Length: 9cm 9 cm Needle Gauge: 22 and 22 G  Needle insertion depth: 4 cm   Additional Needles:  Procedures: ultrasound guided (picture in chart) and nerve stimulator Interscalene brachial plexus block  Nerve Stimulator or Paresthesia:  Response: biceps flexion, 0.6 mA, 4 cm  Additional Responses:   Narrative:  Start time: 03/18/2016 10:12 AM End time: 03/18/2016 10:34 AM Injection made incrementally with aspirations every 5 mL.  Performed by: Personally  Anesthesiologist: Naomie DeanKEPHART, WILLIAM K  Additional Notes: Functioning IV was confirmed and monitors were applied.  A 50mm 22ga Stimuplex needle was used. Sterile prep and drape,hand hygiene and sterile gloves were used.  Negative aspiration and  negative test dose prior to incremental administration of local anesthetic. The patient tolerated the procedure well.

## 2016-03-18 NOTE — Anesthesia Postprocedure Evaluation (Signed)
Anesthesia Post Note  Patient: [place] CraneFrances D Litaker  Procedure(s) Performed: Procedure(s) (LRB): SHOULDER ARTHROSCOPY WITH OPEN ROTATOR CUFF REPAIR (Left)  Patient location during evaluation: PACU Anesthesia Type: General Level of consciousness: awake and alert Pain management: pain level controlled Vital Signs Assessment: post-procedure vital signs reviewed and stable Respiratory status: spontaneous breathing and respiratory function stable Cardiovascular status: stable Anesthetic complications: no    Last Vitals:  Filed Vitals:   03/18/16 1341 03/18/16 1345  BP: 136/75   Pulse: 122 117  Temp: 36.6 C   Resp:  21    Last Pain:  Filed Vitals:   03/18/16 1347  PainSc: Asleep                 KEPHART,WILLIAM K

## 2016-03-18 NOTE — Discharge Instructions (Addendum)
AMBULATORY SURGERY  °DISCHARGE INSTRUCTIONS ° ° °1) The drugs that you were given will stay in your system until tomorrow so for the next 24 hours you should not: ° °A) Drive an automobile °B) Make any legal decisions °C) Drink any alcoholic beverage ° ° °2) You may resume regular meals tomorrow.  Today it is better to start with liquids and gradually work up to solid foods. ° °You may eat anything you prefer, but it is better to start with liquids, then soup and crackers, and gradually work up to solid foods. ° ° °3) Please notify your doctor immediately if you have any unusual bleeding, trouble breathing, redness and pain at the surgery site, drainage, fever, or pain not relieved by medication. ° °Please contact your physician with any problems or Same Day Surgery at [phone], Monday through Friday 6 am to 4 pm, or [facility] Main number at [phone]. °

## 2016-03-19 ENCOUNTER — Encounter: Payer: Self-pay | Admitting: Specialist

## 2016-04-26 IMAGING — CR DG CHEST 2V
2 series · 2 of 2 positions shown · non-contrast
Comparison: 11/05/2010

CLINICAL DATA: Cough.

EXAM:
CHEST  2 VIEW

[chest pa]
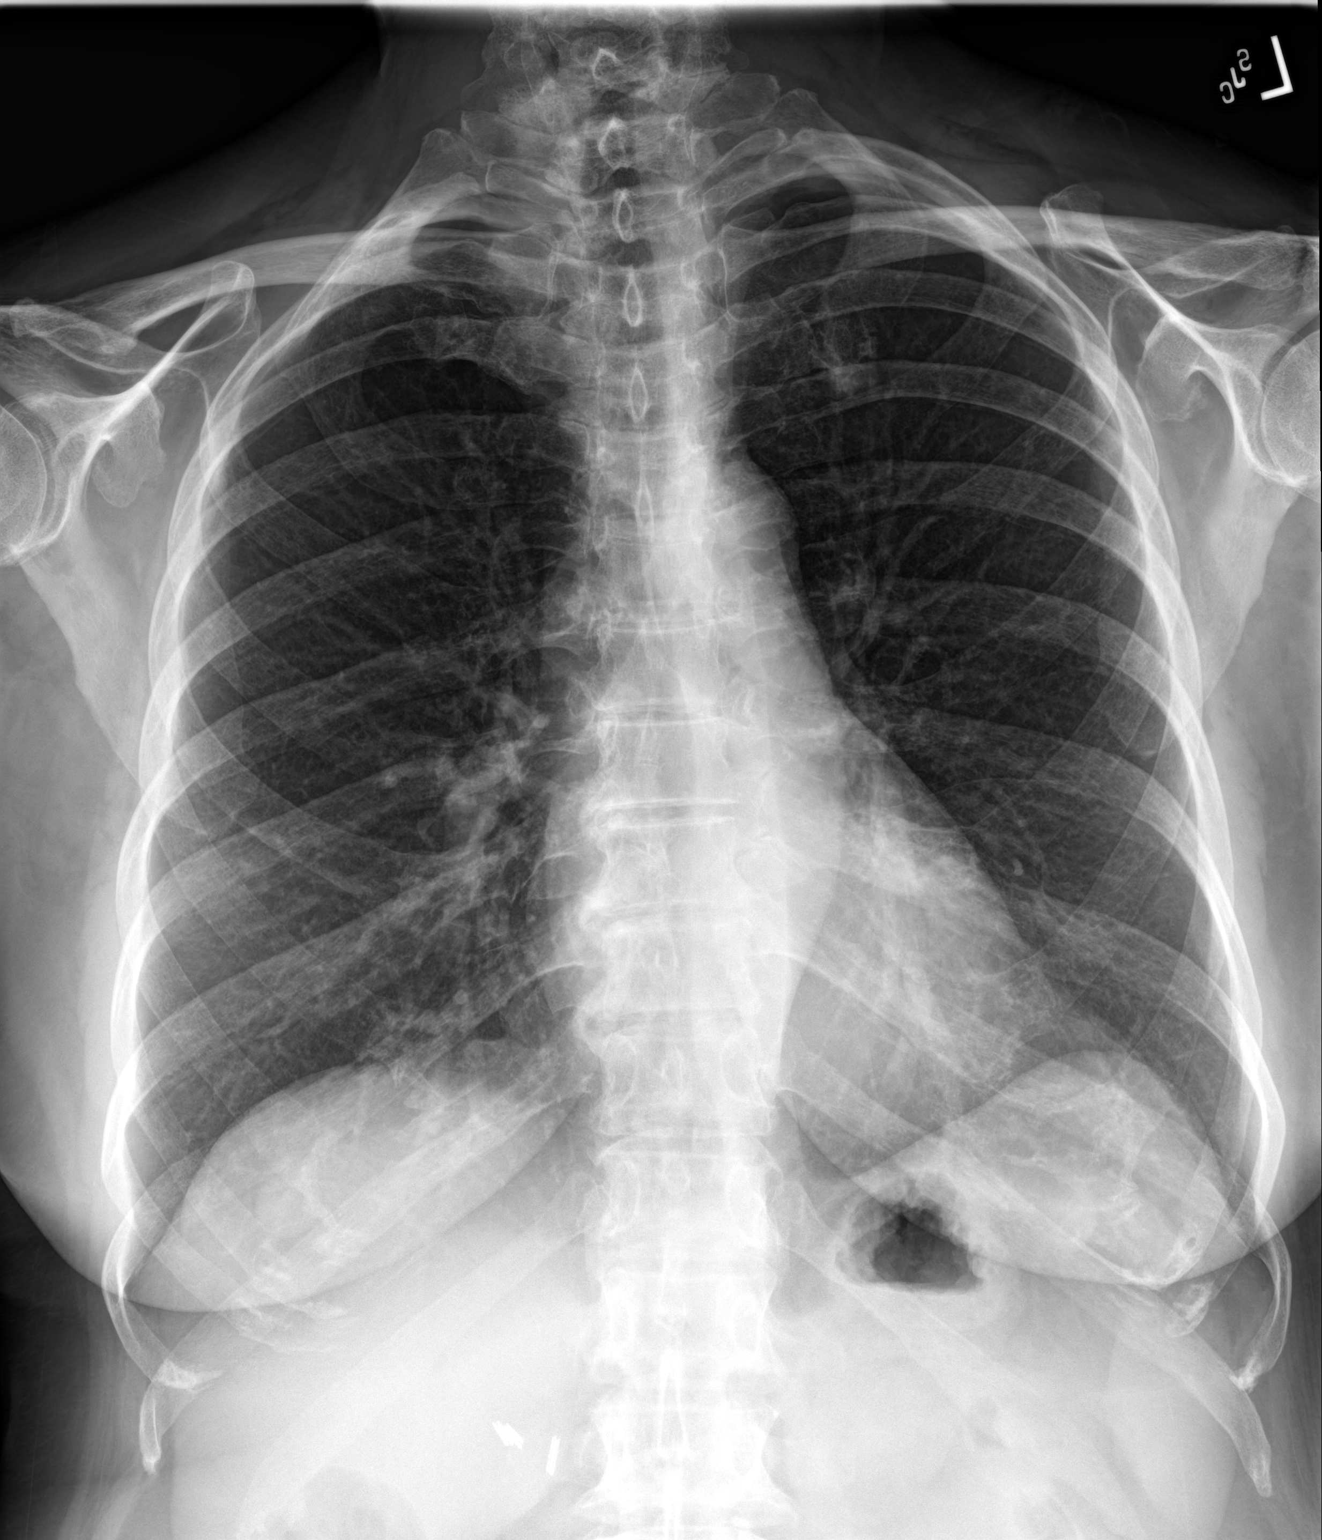

[chest lat]
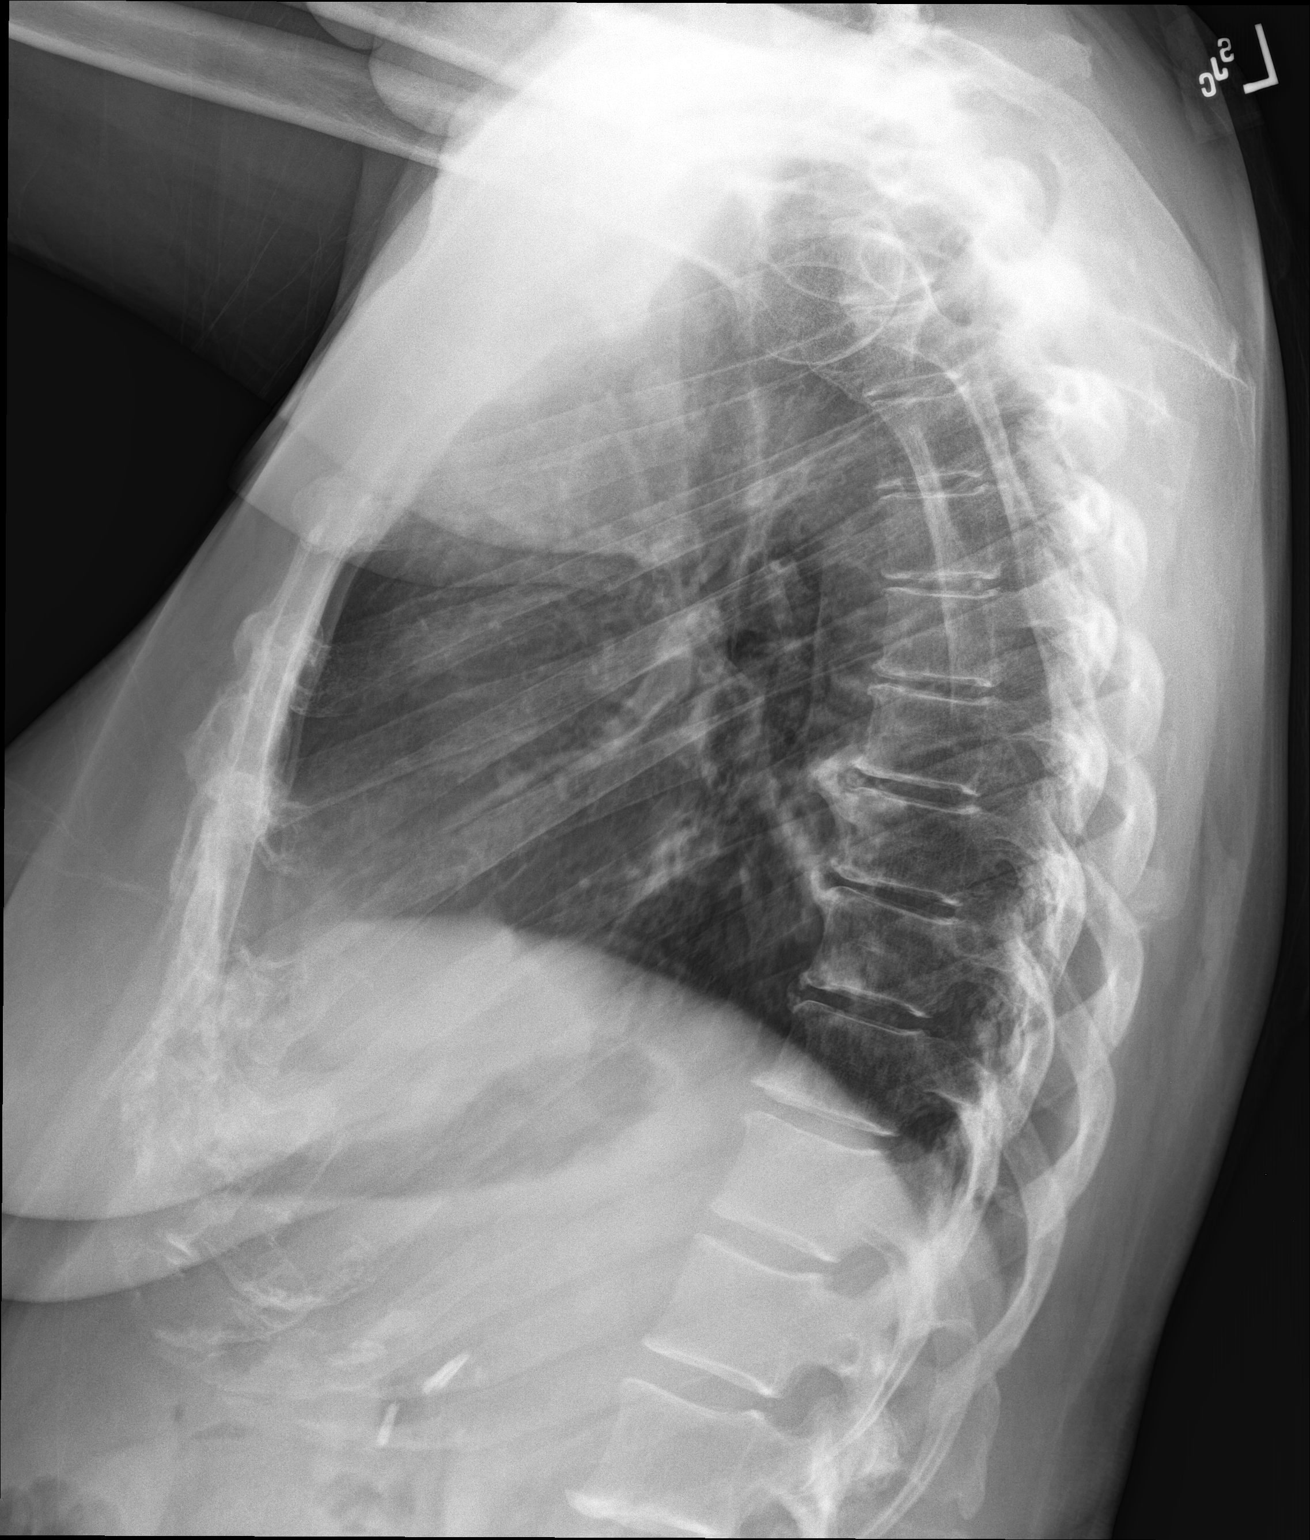

[2 of 2 positions shown; findings below may reference images not displayed]

FINDINGS: Cardiomediastinal silhouette is normal. Mediastinal contours appear
intact. The aorta is torturous.

There is no evidence of focal airspace consolidation, pleural
effusion or pneumothorax. There is a focal asymmetry overlying the
left upper thorax.

Osseous structures are without acute abnormality. Soft tissues are
grossly normal.
IMPRESSION: No evidence of airspace consolidation.

Focal asymmetry overlying the left upper thorax. This may represent
confluence of bony shadows, however pulmonary nodule cannot be
excluded. Further evaluation with cross-sectional imaging may be
considered.

These results will be called to the ordering clinician or
representative by the Radiologist Assistant, and communication
documented in the PACS or zVision Dashboard.

## 2017-11-22 DIAGNOSIS — M5412 Radiculopathy, cervical region: Secondary | ICD-10-CM | POA: Insufficient documentation

## 2019-03-10 ENCOUNTER — Other Ambulatory Visit: Payer: Self-pay | Admitting: Family Medicine

## 2019-03-10 DIAGNOSIS — R102 Pelvic and perineal pain: Secondary | ICD-10-CM

## 2019-03-10 DIAGNOSIS — N950 Postmenopausal bleeding: Secondary | ICD-10-CM

## 2019-03-20 ENCOUNTER — Ambulatory Visit: Payer: Medicare Other

## 2019-11-27 ENCOUNTER — Other Ambulatory Visit: Payer: Self-pay | Admitting: Family Medicine

## 2019-11-27 DIAGNOSIS — N950 Postmenopausal bleeding: Secondary | ICD-10-CM

## 2019-12-04 ENCOUNTER — Ambulatory Visit
Admission: RE | Admit: 2019-12-04 | Discharge: 2019-12-04 | Disposition: A | Discharge status: Home / Self Care | Payer: Medicare Other | Source: Ambulatory Visit | Attending: Family Medicine | Admitting: Family Medicine

## 2019-12-04 ENCOUNTER — Other Ambulatory Visit: Payer: Self-pay

## 2019-12-04 DIAGNOSIS — N950 Postmenopausal bleeding: Secondary | ICD-10-CM | POA: Diagnosis not present

## 2019-12-06 ENCOUNTER — Telehealth: Payer: Self-pay | Admitting: Obstetrics & Gynecology

## 2019-12-06 NOTE — Telephone Encounter (Signed)
[facility] medical referring for Postmenopausal bleeding wiht thicked endometrium. Called and left voicemail for patient to call back to be schedule

## 2019-12-08 NOTE — Telephone Encounter (Signed)
Called and left voice mail for patient to call back to be schedule °

## 2020-01-02 ENCOUNTER — Ambulatory Visit (INDEPENDENT_AMBULATORY_CARE_PROVIDER_SITE_OTHER): Payer: Medicare Other | Admitting: Obstetrics and Gynecology

## 2020-01-02 ENCOUNTER — Other Ambulatory Visit: Payer: Self-pay

## 2020-01-02 ENCOUNTER — Encounter: Payer: Self-pay | Admitting: Obstetrics and Gynecology

## 2020-01-02 VITALS — BP 132/70 | HR 73 | Wt 189.0 lb

## 2020-01-02 DIAGNOSIS — N950 Postmenopausal bleeding: Secondary | ICD-10-CM | POA: Diagnosis not present

## 2020-01-02 NOTE — Progress Notes (Signed)
H&P  Brianna [name] is an 74 y.o. female.  HPI: She presents today with complaints od postmenopausal bleeding. She reports that the bleeding comes and goes. This bleeding has been present for 4-6 months. It is light and mostly ween when she wipes. She has some cramping.   GYN History Menarche: 18 History of irregular periods every 4-5 months.  History of heavy bleeding and severe pain.  She missed school from pain and bleeding.  Menopause 45-49 No history of fibroids, polyps, or ovarian cyst.  No hx of STDs Not sexually active for 8 years No history of abnormal pap smears Tubal ligation in 1972 Took OCP x 1.5 years. Has been on 15 + years of hormone replacement therapy with Estradiol and progesterone.  No family history of breast, uterine, and ovarian cancer.   OB History  04/16/1969- SVD, female, 1 child. No issue.   Past Medical History:  Diagnosis Date  . Hypertension     Past Surgical History:  Procedure Laterality Date  . CATARACT EXTRACTION, BILATERAL Bilateral   . CHOLECYSTECTOMY    . COLONOSCOPY WITH PROPOFOL N/A 10/17/2015   Procedure: COLONOSCOPY WITH PROPOFOL;  Surgeon: Midge Minium, MD;  Location: [facility];  Service: Endoscopy;  Laterality: N/A;  . POLYPECTOMY  10/17/2015   Procedure: POLYPECTOMY;  Surgeon: Midge Minium, MD;  Location: [facility];  Service: Endoscopy;;  cecum polyp  . ROTATOR CUFF REPAIR Right   . SHOULDER ARTHROSCOPY WITH OPEN ROTATOR CUFF REPAIR Left 03/18/2016   Procedure: SHOULDER ARTHROSCOPY WITH OPEN ROTATOR CUFF REPAIR;  Surgeon: Deeann Saint, MD;  Location: [facility] OR[facility];  Service: Orthopedics;  Laterality: Left;  . TONSILLECTOMY      Family History  Problem Relation Age of Onset  . Colon cancer Sister     Social History:  reports that she has never smoked. She has never used smokeless tobacco. She reports that she does not drink alcohol or use drugs.  Allergies:  Allergies  Allergen Reactions  . Hydrocodone  Itching and Nausea And Vomiting  . Sulfa Antibiotics Other (See Comments)    headaches  . Penicillins Rash    Did it involve swelling of the face/tongue/throat, SOB, or low BP? No Did it involve sudden or severe rash/hives, skin peeling, or any reaction on the inside of your mouth or nose? Yes Did you need to seek medical attention at a hospital or doctor's office? Yes When did it last happen?~8 years ago If all above answers are "NO", may proceed with cephalosporin use.     Medications: I have reviewed the patient's current medications.  No results found for this or any previous visit (from the past 48 hour(s)).  No results found.  Review of Systems  Constitutional: Negative for chills and fever.  HENT: Negative for congestion, hearing loss and sinus pain.   Respiratory: Negative for cough, shortness of breath and wheezing.   Cardiovascular: Negative for chest pain, palpitations and leg swelling.  Gastrointestinal: Negative for abdominal pain, constipation, diarrhea, nausea and vomiting.  Genitourinary: Negative for dysuria, flank pain, frequency, hematuria and urgency.  Musculoskeletal: Negative for back pain.  Skin: Negative for rash.  Neurological: Negative for dizziness and headaches.  Psychiatric/Behavioral: Negative for suicidal ideas. The patient is not nervous/anxious.    Blood pressure 132/70, pulse 73, weight 189 lb (85.7 kg). Physical Exam  Nursing note and vitals reviewed. Constitutional: She is oriented to person, place, and time. She appears well-developed and well-nourished.  HENT:  Head: Normocephalic and atraumatic.  Cardiovascular: Normal rate and regular rhythm.  Respiratory: Effort normal and breath sounds normal.  GI: Soft. Bowel sounds are normal.  Musculoskeletal:        General: Normal range of motion.  Neurological: She is alert and oriented to person, place, and time.  Skin: Skin is warm and dry.  Psychiatric: She has a normal mood and affect.  Her behavior is normal. Judgment and thought content normal.   74 yo with postmenopausal bleeding.  Declines pelvic exam and biopsy today, would prefer to do in the OR  Will plan close follow up and [place].  Encouraged patient to discontinue hormone replacement therapy.  Discussed plan of care and evaluation of postmenopausal bleeding.   More than 40 minutes were spent face to face with the patient in the room with more than 50% of the time spent providing counseling and discussing the plan of management.  Adrian Prows MD [facility], [facility] 02/05/2020 4:58 PM

## 2020-01-12 ENCOUNTER — Telehealth: Payer: Self-pay | Admitting: Obstetrics and Gynecology

## 2020-01-12 NOTE — Telephone Encounter (Signed)
-----   Message from Natale Milch, MD sent at 01/02/2020  3:30 PM EST ----- Surgery Booking Request Patient Full Name:  Brianna [name]  MRN: [id number]  DOB: March 03, 1946  Surgeon: Natale Milch, MD  Requested Surgery Date and Time: ASAP Primary Diagnosis AND Code: Postmenopausal bleeding N95.0 Secondary Diagnosis and Code:  Surgical Procedure: Hysteroscopy D&C L&D Notification: No Admission Status: same day surgery Length of Surgery: 1 hour Special Case Needs: No H&P: No Phone Interview???:  No Interpreter: No Language:  Medical Clearance:  No Special Scheduling Instructions: ASAP Any known health/anesthesia issues, diabetes, sleep apnea, latex allergy, defibrillator/pacemaker?: No Acuity: P2   (P1 highest, P2 delay may cause harm, P3 low, elective gyn, P4 lowest)

## 2020-01-12 NOTE — Telephone Encounter (Signed)
Lmtrc

## 2020-01-16 NOTE — Telephone Encounter (Signed)
Patient returned the call, and is aware of H&P at [facility] on 1/22 @ 1:30pm w/ Dr. Jerene Pitch, Pre-admit Testing to be scheduled, COVID testing on 2/5, and OR on 02/06/20. Patient is aware to quarantine after COVID testing. Patient is aware she may receive calls from the [facility] and [facility]. Patient confirmed [employer] and Medicare.

## 2020-01-19 ENCOUNTER — Encounter: Payer: Self-pay | Admitting: Obstetrics and Gynecology

## 2020-01-19 ENCOUNTER — Ambulatory Visit (INDEPENDENT_AMBULATORY_CARE_PROVIDER_SITE_OTHER): Payer: Medicare Other | Admitting: Obstetrics and Gynecology

## 2020-01-19 ENCOUNTER — Other Ambulatory Visit: Payer: Self-pay

## 2020-01-19 VITALS — BP 138/88 | Ht 64.0 in | Wt 189.0 lb

## 2020-01-19 DIAGNOSIS — R9389 Abnormal findings on diagnostic imaging of other specified body structures: Secondary | ICD-10-CM | POA: Diagnosis not present

## 2020-01-19 DIAGNOSIS — N950 Postmenopausal bleeding: Secondary | ICD-10-CM | POA: Diagnosis not present

## 2020-01-19 NOTE — H&P (View-Only) (Signed)
H&P  Brianna [name] is an 74 y.o. female.  HPI: No complaints. Has not had further bleeding since her visit with me on 01/02/2020. She has a hysteroscopy D&C scheduled for 02/06/2020 to sample her thickened endometrium.    Past Medical History:  Diagnosis Date  . Hypertension     Past Surgical History:  Procedure Laterality Date  . CHOLECYSTECTOMY    . COLONOSCOPY WITH PROPOFOL N/A 10/17/2015   Procedure: COLONOSCOPY WITH PROPOFOL;  Surgeon: Midge Minium, MD;  Location: [facility];  Service: Endoscopy;  Laterality: N/A;  . POLYPECTOMY  10/17/2015   Procedure: POLYPECTOMY;  Surgeon: Midge Minium, MD;  Location: [facility];  Service: Endoscopy;;  cecum polyp  . ROTATOR CUFF REPAIR Right   . SHOULDER ARTHROSCOPY WITH OPEN ROTATOR CUFF REPAIR Left 03/18/2016   Procedure: SHOULDER ARTHROSCOPY WITH OPEN ROTATOR CUFF REPAIR;  Surgeon: Deeann Saint, MD;  Location: [facility] OR[facility];  Service: Orthopedics;  Laterality: Left;  . TONSILLECTOMY      History reviewed. No pertinent family history.  Social History:  reports that she has never smoked. She has never used smokeless tobacco. She reports that she does not drink alcohol or use drugs.  Allergies:  Allergies  Allergen Reactions  . Hydrocodone Itching and Nausea And Vomiting  . Sulfa Antibiotics Other (See Comments)    headaches  . Penicillins Rash    Did it involve swelling of the face/tongue/throat, SOB, or low BP? No Did it involve sudden or severe rash/hives, skin peeling, or any reaction on the inside of your mouth or nose? Yes Did you need to seek medical attention at a hospital or doctor's office? Yes When did it last happen?~8 years ago If all above answers are "NO", may proceed with cephalosporin use.     Medications: I have reviewed the patient's current medications.  No results found for this or any previous visit (from the past 48 hour(s)).  No results found.  Review of Systems  Constitutional: Negative  for chills and fever.  HENT: Negative for congestion, hearing loss and sinus pain.   Respiratory: Negative for cough, shortness of breath and wheezing.   Cardiovascular: Negative for chest pain, palpitations and leg swelling.  Gastrointestinal: Negative for abdominal pain, constipation, diarrhea, nausea and vomiting.  Genitourinary: Negative for dysuria, flank pain, frequency, hematuria and urgency.  Musculoskeletal: Negative for back pain.  Skin: Negative for rash.  Neurological: Negative for dizziness and headaches.  Psychiatric/Behavioral: Negative for suicidal ideas. The patient is not nervous/anxious.    Blood pressure 138/88, height 5\' 4"  (1.626 m), weight 189 lb (85.7 kg). Physical Exam  Nursing note and vitals reviewed. Constitutional: She is oriented to person, place, and time. She appears well-developed and well-nourished.  HENT:  Head: Normocephalic and atraumatic.  Cardiovascular: Normal rate and regular rhythm.  Respiratory: Effort normal and breath sounds normal.  GI: Soft. Bowel sounds are normal.  Musculoskeletal:        General: Normal range of motion.  Neurological: She is alert and oriented to person, place, and time.  Skin: Skin is warm and dry.  Psychiatric: She has a normal mood and affect. Her behavior is normal. Judgment and thought content normal.    Assessment/Plan: 74 yo with postmenopausal bleeding and thickened endometrium Declines office biopsy and is scheduled for a hysteroscopy D&C on 02/06/2020 Discussed in detail risks benefits and alternatives to the procedure including but not limited to infection, bleeding, and damage to surrounding pelvic organs. She agrees to proceed. Consents signed.  Will plan close post operative follow up.   More than 15 minutes were spent face to face with the patient in the room with more than 50% of the time spent providing counseling and discussing the plan of management.    Tauri Ethington R Mende Biswell 01/19/2020, 1:55 PM

## 2020-01-19 NOTE — Progress Notes (Signed)
H&P  Brianna [name] is an 74 y.o. female.  HPI: No complaints. Has not had further bleeding since her visit with me on 01/02/2020. She has a hysteroscopy D&C scheduled for 02/06/2020 to sample her thickened endometrium.    Past Medical History:  Diagnosis Date  . Hypertension     Past Surgical History:  Procedure Laterality Date  . CHOLECYSTECTOMY    . COLONOSCOPY WITH PROPOFOL N/A 10/17/2015   Procedure: COLONOSCOPY WITH PROPOFOL;  Surgeon: Midge Minium, MD;  Location: [facility];  Service: Endoscopy;  Laterality: N/A;  . POLYPECTOMY  10/17/2015   Procedure: POLYPECTOMY;  Surgeon: Midge Minium, MD;  Location: [facility];  Service: Endoscopy;;  cecum polyp  . ROTATOR CUFF REPAIR Right   . SHOULDER ARTHROSCOPY WITH OPEN ROTATOR CUFF REPAIR Left 03/18/2016   Procedure: SHOULDER ARTHROSCOPY WITH OPEN ROTATOR CUFF REPAIR;  Surgeon: Deeann Saint, MD;  Location: [facility] OR[facility];  Service: Orthopedics;  Laterality: Left;  . TONSILLECTOMY      History reviewed. No pertinent family history.  Social History:  reports that she has never smoked. She has never used smokeless tobacco. She reports that she does not drink alcohol or use drugs.  Allergies:  Allergies  Allergen Reactions  . Hydrocodone Itching and Nausea And Vomiting  . Sulfa Antibiotics Other (See Comments)    headaches  . Penicillins Rash    Did it involve swelling of the face/tongue/throat, SOB, or low BP? No Did it involve sudden or severe rash/hives, skin peeling, or any reaction on the inside of your mouth or nose? Yes Did you need to seek medical attention at a hospital or doctor's office? Yes When did it last happen?~8 years ago If all above answers are "NO", may proceed with cephalosporin use.     Medications: I have reviewed the patient's current medications.  No results found for this or any previous visit (from the past 48 hour(s)).  No results found.  Review of Systems  Constitutional: Negative  for chills and fever.  HENT: Negative for congestion, hearing loss and sinus pain.   Respiratory: Negative for cough, shortness of breath and wheezing.   Cardiovascular: Negative for chest pain, palpitations and leg swelling.  Gastrointestinal: Negative for abdominal pain, constipation, diarrhea, nausea and vomiting.  Genitourinary: Negative for dysuria, flank pain, frequency, hematuria and urgency.  Musculoskeletal: Negative for back pain.  Skin: Negative for rash.  Neurological: Negative for dizziness and headaches.  Psychiatric/Behavioral: Negative for suicidal ideas. The patient is not nervous/anxious.    Blood pressure 138/88, height 5\' 4"  (1.626 m), weight 189 lb (85.7 kg). Physical Exam  Nursing note and vitals reviewed. Constitutional: She is oriented to person, place, and time. She appears well-developed and well-nourished.  HENT:  Head: Normocephalic and atraumatic.  Cardiovascular: Normal rate and regular rhythm.  Respiratory: Effort normal and breath sounds normal.  GI: Soft. Bowel sounds are normal.  Musculoskeletal:        General: Normal range of motion.  Neurological: She is alert and oriented to person, place, and time.  Skin: Skin is warm and dry.  Psychiatric: She has a normal mood and affect. Her behavior is normal. Judgment and thought content normal.    Assessment/Plan: 74 yo with postmenopausal bleeding and thickened endometrium Declines office biopsy and is scheduled for a hysteroscopy D&C on 02/06/2020 Discussed in detail risks benefits and alternatives to the procedure including but not limited to infection, bleeding, and damage to surrounding pelvic organs. She agrees to proceed. Consents signed.  Will plan close post operative follow up.   More than 15 minutes were spent face to face with the patient in the room with more than 50% of the time spent providing counseling and discussing the plan of management.    Brianna [name] 01/19/2020, 1:55 PM      

## 2020-01-29 ENCOUNTER — Encounter
Admission: RE | Admit: 2020-01-29 | Discharge: 2020-01-29 | Disposition: A | Discharge status: Home / Self Care | Payer: Medicare Other | Source: Ambulatory Visit | Attending: Obstetrics and Gynecology | Admitting: Obstetrics and Gynecology

## 2020-01-29 ENCOUNTER — Other Ambulatory Visit: Payer: Self-pay

## 2020-01-29 NOTE — Patient Instructions (Signed)
Your procedure is scheduled on: 02/06/20 Report to DAY SURGERY DEPARTMENT LOCATED ON 2ND FLOOR MEDICAL MALL ENTRANCE. To find out your arrival time please call [phone] between 1PM - 3PM on 02/05/20.  Remember: Instructions that are not followed completely may result in serious medical risk, up to and including death, or upon the discretion of your surgeon and anesthesiologist your surgery may need to be rescheduled.     _X__ 1. Do not eat food after midnight the night before your procedure.                 No gum chewing or hard candies. You may drink clear liquids up to 2 hours                 before you are scheduled to arrive for your surgery- DO not drink clear                 liquids within 2 hours of the start of your surgery.                 Clear Liquids include:  water, apple juice without pulp, clear carbohydrate                 drink such as Clearfast or Gatorade, Black Coffee or Tea (Do not add                 anything to coffee or tea). Diabetics water only  __X__2.  On the morning of surgery brush your teeth with toothpaste and water, you                 may rinse your mouth with mouthwash if you wish.  Do not swallow any              toothpaste of mouthwash.     _X__ 3.  No Alcohol for 24 hours before or after surgery.   _X__ 4.  Do Not Smoke or use e-cigarettes For 24 Hours Prior to Your Surgery.                 Do not use any chewable tobacco products for at least 6 hours prior to                 surgery.  ____  5.  Bring all medications with you on the day of surgery if instructed.   __X__  6.  Notify your doctor if there is any change in your medical condition      (cold, fever, infections).     Do not wear jewelry, make-up, hairpins, clips or nail polish. Do not wear lotions, powders, or perfumes.  Do not shave 48 hours prior to surgery. Men may shave face and neck. Do not bring valuables to the hospital.    [facility] is not responsible for any belongings or  valuables.  Contacts, dentures/partials or body piercings may not be worn into surgery. Bring a case for your contacts, glasses or hearing aids, a denture cup will be supplied. Leave your suitcase in the car. After surgery it may be brought to your room. For patients admitted to the hospital, discharge time is determined by your treatment team.   Patients discharged the day of surgery will not be allowed to drive home.   Please read over the following fact sheets that you were given:   MRSA Information  __X__ Take these medicines the morning of surgery with A SIP OF WATER:  1. aMLODIPINE  2.   3.   4.  5.  6.  ____ Fleet Enema (as directed)   ____ Use CHG Soap/SAGE wipes as directed  ____ Use inhalers on the day of surgery  ____ Stop metformin/Janumet/Farxiga 2 days prior to surgery    ____ Take 1/2 of usual insulin dose the night before surgery. No insulin the morning          of surgery.   ____ Stop Blood Thinners Coumadin/Plavix/Xarelto/Pleta/Pradaxa/Eliquis/Effient/Aspirin  on   Or contact your Surgeon, Cardiologist or Medical Doctor regarding  ability to stop your blood thinners  __X__ Stop Anti-inflammatories 7 days before surgery such as Advil, Ibuprofen, Motrin,  BC or Goodies Powder, Naprosyn, Naproxen, Aleve, Aspirin    __X__ Stop all herbal supplements, fish oil or vitamin E until after surgery.    ____ Bring C-Pap to the hospital.     FINISH THE ENSURE PRE SURGERY DRINK 2 HOURS PRIOR TO ARRIVAL 02/06/20.

## 2020-01-30 ENCOUNTER — Encounter
Admission: RE | Admit: 2020-01-30 | Discharge: 2020-01-30 | Disposition: A | Discharge status: Home / Self Care | Payer: Medicare Other | Source: Ambulatory Visit | Attending: Obstetrics and Gynecology | Admitting: Obstetrics and Gynecology

## 2020-01-30 DIAGNOSIS — Z01810 Encounter for preprocedural cardiovascular examination: Secondary | ICD-10-CM | POA: Insufficient documentation

## 2020-01-30 DIAGNOSIS — Z01812 Encounter for preprocedural laboratory examination: Secondary | ICD-10-CM | POA: Diagnosis present

## 2020-01-30 LAB — CBC
HCT: 43.1 % (ref 36.0–46.0)
Hemoglobin: 13.8 g/dL (ref 12.0–15.0)
MCH: 28.4 pg (ref 26.0–34.0)
MCHC: 32 g/dL (ref 30.0–36.0)
MCV: 88.7 fL (ref 80.0–100.0)
Platelets: 203 10*3/uL (ref 150–400)
RBC: 4.86 MIL/uL (ref 3.87–5.11)
RDW: 13.7 % (ref 11.5–15.5)
WBC: 7.4 10*3/uL (ref 4.0–10.5)
nRBC: 0 % (ref 0.0–0.2)

## 2020-01-30 LAB — TYPE AND SCREEN
ABO/RH(D): O POS
Antibody Screen: NEGATIVE

## 2020-02-02 ENCOUNTER — Other Ambulatory Visit
Admission: RE | Admit: 2020-02-02 | Discharge: 2020-02-02 | Disposition: A | Discharge status: Home / Self Care | Payer: Medicare Other | Source: Ambulatory Visit | Attending: Obstetrics and Gynecology | Admitting: Obstetrics and Gynecology

## 2020-02-02 DIAGNOSIS — Z01812 Encounter for preprocedural laboratory examination: Secondary | ICD-10-CM | POA: Insufficient documentation

## 2020-02-02 DIAGNOSIS — Z20822 Contact with and (suspected) exposure to covid-19: Secondary | ICD-10-CM | POA: Insufficient documentation

## 2020-02-02 LAB — SARS CORONAVIRUS 2 (TAT 6-24 HRS): SARS Coronavirus 2: NEGATIVE

## 2020-02-05 ENCOUNTER — Encounter: Payer: Self-pay | Admitting: Obstetrics and Gynecology

## 2020-02-06 ENCOUNTER — Ambulatory Visit: Payer: Medicare Other | Admitting: Certified Registered Nurse Anesthetist

## 2020-02-06 ENCOUNTER — Encounter
Admission: RE | Disposition: A | Discharge status: Home / Self Care | Payer: Self-pay | Source: Home / Self Care | Attending: Obstetrics and Gynecology

## 2020-02-06 ENCOUNTER — Ambulatory Visit
Admission: RE | Admit: 2020-02-06 | Discharge: 2020-02-06 | Disposition: A | Discharge status: Home / Self Care | Payer: Medicare Other | Attending: Obstetrics and Gynecology | Admitting: Obstetrics and Gynecology

## 2020-02-06 ENCOUNTER — Other Ambulatory Visit: Payer: Self-pay

## 2020-02-06 ENCOUNTER — Encounter: Payer: Self-pay | Admitting: Obstetrics and Gynecology

## 2020-02-06 DIAGNOSIS — N840 Polyp of corpus uteri: Secondary | ICD-10-CM

## 2020-02-06 DIAGNOSIS — Z885 Allergy status to narcotic agent status: Secondary | ICD-10-CM | POA: Diagnosis not present

## 2020-02-06 DIAGNOSIS — Z882 Allergy status to sulfonamides status: Secondary | ICD-10-CM | POA: Diagnosis not present

## 2020-02-06 DIAGNOSIS — I10 Essential (primary) hypertension: Secondary | ICD-10-CM | POA: Insufficient documentation

## 2020-02-06 DIAGNOSIS — N950 Postmenopausal bleeding: Secondary | ICD-10-CM | POA: Diagnosis present

## 2020-02-06 DIAGNOSIS — Z880 Allergy status to penicillin: Secondary | ICD-10-CM | POA: Diagnosis not present

## 2020-02-06 HISTORY — PX: HYSTEROSCOPY WITH D & C: SHX1775

## 2020-02-06 HISTORY — PX: CERVICAL POLYPECTOMY: SHX88

## 2020-02-06 LAB — ABO/RH: ABO/RH(D): O POS

## 2020-02-06 SURGERY — DILATATION AND CURETTAGE /HYSTEROSCOPY
Anesthesia: General

## 2020-02-06 MED ORDER — LIDOCAINE HCL (CARDIAC) PF 100 MG/5ML IV SOSY
PREFILLED_SYRINGE | INTRAVENOUS | Status: DC | PRN
  Administered 2020-02-06: 80 mg via INTRAVENOUS

## 2020-02-06 MED ORDER — OXYCODONE HCL 5 MG PO TABS
ORAL_TABLET | ORAL | Status: AC
  Filled 2020-02-06: qty 1

## 2020-02-06 MED ORDER — DEXAMETHASONE SODIUM PHOSPHATE 10 MG/ML IJ SOLN
INTRAMUSCULAR | Status: DC | PRN
  Administered 2020-02-06: 10 mg via INTRAVENOUS

## 2020-02-06 MED ORDER — PROPOFOL 10 MG/ML IV BOLUS
INTRAVENOUS | Status: DC | PRN
  Administered 2020-02-06: 140 mg via INTRAVENOUS

## 2020-02-06 MED ORDER — GLYCOPYRROLATE 0.2 MG/ML IJ SOLN
INTRAMUSCULAR | Status: AC
  Filled 2020-02-06: qty 1

## 2020-02-06 MED ORDER — ONDANSETRON HCL 4 MG/2ML IJ SOLN
INTRAMUSCULAR | Status: DC | PRN
  Administered 2020-02-06: 4 mg via INTRAVENOUS

## 2020-02-06 MED ORDER — GLYCOPYRROLATE 0.2 MG/ML IJ SOLN
INTRAMUSCULAR | Status: DC | PRN
  Administered 2020-02-06: .2 mg via INTRAVENOUS

## 2020-02-06 MED ORDER — FAMOTIDINE 20 MG PO TABS: 20.0000 mg | ORAL_TABLET | Freq: Once | ORAL | Status: AC

## 2020-02-06 MED ORDER — OXYCODONE HCL 5 MG/5ML PO SOLN: 5.0000 mg | Freq: Once | ORAL | Status: AC | PRN

## 2020-02-06 MED ORDER — KETOROLAC TROMETHAMINE 30 MG/ML IJ SOLN
INTRAMUSCULAR | Status: AC
  Filled 2020-02-06: qty 1

## 2020-02-06 MED ORDER — FENTANYL CITRATE (PF) 100 MCG/2ML IJ SOLN
INTRAMUSCULAR | Status: DC | PRN
  Administered 2020-02-06 (×3): 25 ug via INTRAVENOUS

## 2020-02-06 MED ORDER — LACTATED RINGERS IV SOLN: INTRAVENOUS | Status: DC

## 2020-02-06 MED ORDER — FENTANYL CITRATE (PF) 100 MCG/2ML IJ SOLN
INTRAMUSCULAR | Status: AC
  Filled 2020-02-06: qty 2

## 2020-02-06 MED ORDER — PENTAFLUOROPROP-TETRAFLUOROETH EX AERO
INHALATION_SPRAY | CUTANEOUS | Status: AC
  Filled 2020-02-06: qty 116

## 2020-02-06 MED ORDER — FAMOTIDINE 20 MG PO TABS
ORAL_TABLET | ORAL | Status: AC
  Administered 2020-02-06: 20 mg via ORAL
  Filled 2020-02-06: qty 1

## 2020-02-06 MED ORDER — ONDANSETRON HCL 4 MG/2ML IJ SOLN
INTRAMUSCULAR | Status: AC
  Filled 2020-02-06: qty 2

## 2020-02-06 MED ORDER — OXYCODONE HCL 5 MG PO TABS
5.0000 mg | ORAL_TABLET | Freq: Once | ORAL | Status: AC | PRN
  Administered 2020-02-06: 09:00:00 5 mg via ORAL

## 2020-02-06 MED ORDER — IBUPROFEN 800 MG PO TABS: 800.0000 mg | ORAL_TABLET | Freq: Three times a day (TID) | ORAL | 0 refills | Status: DC | PRN

## 2020-02-06 MED ORDER — LIDOCAINE HCL (PF) 2 % IJ SOLN
INTRAMUSCULAR | Status: AC
  Filled 2020-02-06: qty 10

## 2020-02-06 MED ORDER — PROPOFOL 10 MG/ML IV BOLUS
INTRAVENOUS | Status: AC
  Filled 2020-02-06: qty 20

## 2020-02-06 MED ORDER — FENTANYL CITRATE (PF) 100 MCG/2ML IJ SOLN: 25.0000 ug | INTRAMUSCULAR | Status: DC | PRN

## 2020-02-06 SURGICAL SUPPLY — 19 items
BAG INFUSER PRESSURE 100CC (MISCELLANEOUS) ×4 IMPLANT
CATH ROBINSON RED A/P 16FR (CATHETERS) ×4 IMPLANT
DEVICE MYOSURE LITE (MISCELLANEOUS) IMPLANT
DEVICE MYOSURE REACH (MISCELLANEOUS) IMPLANT
ELECT REM PT RETURN 9FT ADLT (ELECTROSURGICAL)
ELECTRODE REM PT RTRN 9FT ADLT (ELECTROSURGICAL) IMPLANT
GLOVE BIOGEL PI IND STRL 6.5 (GLOVE) ×4 IMPLANT
GLOVE BIOGEL PI INDICATOR 6.5 (GLOVE) ×4
GLOVE SURG SYN 6.5 ES PF (GLOVE) ×4 IMPLANT
GOWN STRL REUS W/ TWL LRG LVL3 (GOWN DISPOSABLE) ×6 IMPLANT
GOWN STRL REUS W/TWL LRG LVL3 (GOWN DISPOSABLE) ×6
KIT PROCEDURE FLUENT (KITS) IMPLANT
PACK DNC HYST (MISCELLANEOUS) ×4 IMPLANT
PAD OB MATERNITY 4.3X12.25 (PERSONAL CARE ITEMS) ×4 IMPLANT
PAD PREP 24X41 OB/GYN DISP (PERSONAL CARE ITEMS) ×4 IMPLANT
SEAL ROD LENS SCOPE MYOSURE (ABLATOR) ×4 IMPLANT
SOL .9 NS 3000ML IRR  AL (IV SOLUTION) ×2
SOL .9 NS 3000ML IRR UROMATIC (IV SOLUTION) ×2 IMPLANT
TOWEL OR 17X26 4PK STRL BLUE (TOWEL DISPOSABLE) IMPLANT

## 2020-02-06 NOTE — Anesthesia Preprocedure Evaluation (Signed)
Anesthesia Evaluation  Patient identified by MRN, date of birth, ID band Patient awake    Reviewed: Allergy & Precautions, H&P , NPO status , Patient's Chart, lab work & pertinent test results  Airway Mallampati: III  TM Distance: <3 FB Neck ROM: full    Dental  (+) Teeth Intact, Caps   Pulmonary neg pulmonary ROS, Not current smoker,           Cardiovascular hypertension,      Neuro/Psych negative neurological ROS  negative psych ROS   GI/Hepatic negative GI ROS, Neg liver ROS,   Endo/Other  negative endocrine ROS  Renal/GU      Musculoskeletal   Abdominal   Peds  Hematology negative hematology ROS (+)   Anesthesia Other Findings Past Medical History: No date: Hypertension  Past Surgical History: No date: CATARACT EXTRACTION, BILATERAL; Bilateral No date: CHOLECYSTECTOMY 10/17/2015: COLONOSCOPY WITH PROPOFOL; N/A     Comment:  Procedure: COLONOSCOPY WITH PROPOFOL;  Surgeon: Midge Minium, MD;  Location: [facility];  Service:               Endoscopy;  Laterality: N/A; 10/17/2015: POLYPECTOMY     Comment:  Procedure: POLYPECTOMY;  Surgeon: Midge Minium, MD;                Location: [facility];  Service: Endoscopy;;                cecum polyp No date: ROTATOR CUFF REPAIR; Right 03/18/2016: SHOULDER ARTHROSCOPY WITH OPEN ROTATOR CUFF REPAIR; Left     Comment:  Procedure: SHOULDER ARTHROSCOPY WITH OPEN ROTATOR CUFF               REPAIR;  Surgeon: Deeann Saint, MD;  Location: [facility] ORS;              Service: Orthopedics;  Laterality: Left; No date: TONSILLECTOMY     Reproductive/Obstetrics negative OB ROS                             Anesthesia Physical Anesthesia Plan  ASA: II  Anesthesia Plan: General LMA   Post-op Pain Management:    Induction:   PONV Risk Score and Plan: Dexamethasone, Ondansetron and Treatment may vary due to age or medical  condition  Airway Management Planned:   Additional Equipment:   Intra-op Plan:   Post-operative Plan:   Informed Consent: I have reviewed the patients History and Physical, chart, labs and discussed the procedure including the risks, benefits and alternatives for the proposed anesthesia with the patient or authorized representative who has indicated his/her understanding and acceptance.     Dental Advisory Given  Plan Discussed with: Anesthesiologist  Anesthesia Plan Comments:         Anesthesia Quick Evaluation

## 2020-02-06 NOTE — Interval H&P Note (Signed)
History and Physical Interval Note:  02/06/2020 7:10 AM  Brianna [name]  has presented today for surgery, with the diagnosis of Postmenopausal bleeding N95.0.  The various methods of treatment have been discussed with the patient and family. After consideration of risks, benefits and other options for treatment, the patient has consented to  Procedure(s): DILATATION AND CURETTAGE /HYSTEROSCOPY (N/A) as a surgical intervention.  The patient's history has been reviewed, patient examined, no change in status, stable for surgery.  I have reviewed the patient's chart and labs.  Questions were answered to the patient's satisfaction.     Cassadee Vanzandt R Raiven Belizaire

## 2020-02-06 NOTE — Transfer of Care (Signed)
Immediate Anesthesia Transfer of Care Note  Patient: Brianna [name]  Procedure(s) Performed: DILATATION AND CURETTAGE /HYSTEROSCOPY (N/A )   POLYPECTOMY  Patient Location: PACU  Anesthesia Type:General  Level of Consciousness: drowsy  Airway & Oxygen Therapy: Patient Spontanous Breathing and Patient connected to face mask oxygen  Post-op Assessment: Report given to RN and Post -op Vital signs reviewed and stable  Post vital signs: Reviewed and stable  Last Vitals:  Vitals Value Taken Time  BP 120/75 02/06/20 0824  Temp 36.4 C 02/06/20 0824  Pulse 75 02/06/20 0826  Resp 11 02/06/20 0826  SpO2 97 % 02/06/20 0826  Vitals shown include unvalidated device data.  Last Pain:  Vitals:   02/06/20 0824  TempSrc:   PainSc: Asleep         Complications: No apparent anesthesia complications

## 2020-02-06 NOTE — Discharge Instructions (Addendum)
Hysteroscopy, Care After This sheet gives you information about how to care for yourself after your procedure. Your health care provider may also give you more specific instructions. If you have problems or questions, contact your health care provider. What can I expect after the procedure? After the procedure, it is common to have:  Cramping.  Bleeding. This can vary from light spotting to menstrual-like bleeding. Follow these instructions at home: Activity  Rest for 1-2 days after the procedure.  Do not douche, use tampons, or have sex for 2 weeks after the procedure, or until your health care provider approves.  Do not drive for 24 hours after the procedure, or for as long as told by your health care provider.  Do not drive, use heavy machinery, or drink alcohol while taking prescription pain medicines. Medicines   Take over-the-counter and prescription medicines only as told by your health care provider.  Do not take aspirin during recovery. It can increase the risk of bleeding. General instructions  Do not take baths, swim, or use a hot tub until your health care provider approves. Take showers instead of baths for 2 weeks, or for as long as told by your health care provider.  To prevent or treat constipation while you are taking prescription pain medicine, your health care provider may recommend that you: ? Drink enough fluid to keep your urine clear or pale yellow. ? Take over-the-counter or prescription medicines. ? Eat foods that are high in fiber, such as fresh fruits and vegetables, whole grains, and beans. ? Limit foods that are high in fat and processed sugars, such as fried and sweet foods.  Keep all follow-up visits as told by your health care provider. This is important. Contact a health care provider if:  You feel dizzy or lightheaded.  You feel nauseous.  You have abnormal vaginal discharge.  You have a rash.  You have pain that does not get better with  medicine.  You have chills. Get help right away if:  You have bleeding that is heavier than a normal menstrual period.  You have a fever.  You have pain or cramps that get worse.  You develop new abdominal pain.  You faint.  You have pain in your shoulders.  You have shortness of breath. Summary  After the procedure, you may have cramping and some vaginal bleeding.  Do not douche, use tampons, or have sex for 2 weeks after the procedure, or until your health care provider approves.  Do not take baths, swim, or use a hot tub until your health care provider approves. Take showers instead of baths for 2 weeks, or for as long as told by your health care provider.  Report any unusual symptoms to your health care provider.  Keep all follow-up visits as told by your health care provider. This is important. This information is not intended to replace advice given to you by your health care provider. Make sure you discuss any questions you have with your health care provider. Document Revised: 11/26/2017 Document Reviewed: 01/12/2017 Elsevier Patient Education  2020 [employer]   AMBULATORY SURGERY  DISCHARGE INSTRUCTIONS   1) The drugs that you were given will stay in your system until tomorrow so for the next 24 hours you should not:  A) Drive an automobile B) Make any legal decisions C) Drink any alcoholic beverage   2) You may resume regular meals tomorrow.  Today it is better to start with liquids and gradually work up to   solid foods.  You may eat anything you prefer, but it is better to start with liquids, then soup and crackers, and gradually work up to solid foods.   3) Please notify your doctor immediately if you have any unusual bleeding, trouble breathing, redness and pain at the surgery site, drainage, fever, or pain not relieved by medication.    4) Additional Instructions:        Please contact your physician with any problems or Same Day Surgery  at [phone], Monday through Friday 6 am to 4 pm, or [facility] number at [phone]. 

## 2020-02-06 NOTE — Op Note (Signed)
Operative Note  02/06/2020  PRE-OP DIAGNOSIS: Postmenopausal bleeding  POST-OP DIAGNOSIS: Postmenopausal bleeding, endometrial polyp  SURGEON: Kristion Holifield MD  PROCEDURE: Procedure(s): DILATATION AND CURETTAGE /HYSTEROSCOPY   POLYPECTOMY   ANESTHESIA: Choice   ESTIMATED BLOOD LOSS: less than 5 cc   SPECIMENS:  Endometrial curetting's and endometrial polyp  FLUID DEFICIT: less than 500  COMPLICATIONS: none  DISPOSITION: PACU - hemodynamically stable.  CONDITION: stable  FINDINGS: Exam under anesthesia revealed  8cm mobile uterus with bilateral adnexa normal. Hysteroscopy revealed and endometrial polyp at the uterine fundus. Otherwise normal uterine cavity with bilateral tubal ostia and normal appearing endocervical canal.  PROCEDURE IN DETAIL: After informed consent was obtained, the patient was taken to the operating room where anesthesia was obtained without difficulty. The patient was positioned in the dorsal lithotomy position in Crescent stirrups. The patient's bladder was catheterized with an in and out foley catheter. The patient was examined under anesthesia, with the above noted findings. The weightedspeculum was placed inside the patient's vagina, and the the anterior lip of the cervix was seen and grasped with the tenaculum.  The uterine cavity was sounded to 8 cm, and then the cervix was progressively dilated to a 18 French-Pratt dilator. The 0 degree hysteroscope was introduced, with saline fluid used to distend the intrauterine cavity, with the above noted findings.  The Myosure was used to remove the uterine polyp.  The polyp was removed. Once the cavity was sampled entirely the hysteroscope was removed.   The uterine cavity was curetted until a gritty texture was noted, yielding endometrial curettings. Excellent hemostasis was noted, and all instruments were removed, with excellent hemostasis noted throughout. She was then taken out of dorsal lithotomy. Minimal  discrepancy in fluid was noted.  The patient tolerated the procedure well. Sponge, lap and needle counts were correct x2. The patient was taken to recovery room in excellent condition.  Adelene Idler MD [facility], [facility] 02/06/2020 8:18 AM

## 2020-02-06 NOTE — Anesthesia Procedure Notes (Signed)
Procedure Name: LMA Insertion Date/Time: 02/06/2020 7:32 AM Performed by: Rosanne Gutting, CRNA Pre-anesthesia Checklist: Patient identified, Patient being monitored, Timeout performed, Emergency Drugs available and Suction available Patient Re-evaluated:Patient Re-evaluated prior to induction Oxygen Delivery Method: Circle system utilized Preoxygenation: Pre-oxygenation with 100% oxygen Induction Type: IV induction Ventilation: Mask ventilation without difficulty LMA: LMA inserted LMA Size: 4.0 Tube type: Oral Number of attempts: 1 Placement Confirmation: positive ETCO2 and breath sounds checked- equal and bilateral Tube secured with: Tape Dental Injury: Teeth and Oropharynx as per pre-operative assessment

## 2020-02-07 LAB — SURGICAL PATHOLOGY

## 2020-02-07 NOTE — Anesthesia Postprocedure Evaluation (Signed)
Anesthesia Post Note  Patient: [name]  Procedure(s) Performed: DILATATION AND CURETTAGE /HYSTEROSCOPY (N/A )   POLYPECTOMY  Patient location during evaluation: PACU Anesthesia Type: General Level of consciousness: awake and alert Pain management: pain level controlled Vital Signs Assessment: post-procedure vital signs reviewed and stable Respiratory status: spontaneous breathing, nonlabored ventilation and respiratory function stable Cardiovascular status: blood pressure returned to baseline and stable Postop Assessment: no apparent nausea or vomiting Anesthetic complications: no     Last Vitals:  Vitals:   02/06/20 0903 02/06/20 0947  BP: 128/67 (!) 156/72  Pulse: (!) 59 66  Resp: 18 18  Temp: (!) 36.4 C   SpO2: 97% 100%    Last Pain:  Vitals:   02/07/20 0811  TempSrc:   PainSc: 0-No pain                 Karleen Hampshire

## 2020-02-09 NOTE — Progress Notes (Signed)
Called and discussed with patient

## 2020-02-14 ENCOUNTER — Other Ambulatory Visit: Payer: Self-pay

## 2020-02-14 ENCOUNTER — Encounter: Payer: Self-pay | Admitting: Obstetrics and Gynecology

## 2020-02-14 ENCOUNTER — Ambulatory Visit (INDEPENDENT_AMBULATORY_CARE_PROVIDER_SITE_OTHER): Payer: Medicare Other | Admitting: Obstetrics and Gynecology

## 2020-02-14 VITALS — BP 132/80 | Ht 64.0 in | Wt 190.0 lb

## 2020-02-14 DIAGNOSIS — R140 Abdominal distension (gaseous): Secondary | ICD-10-CM | POA: Diagnosis not present

## 2020-02-14 DIAGNOSIS — Z9889 Other specified postprocedural states: Secondary | ICD-10-CM | POA: Diagnosis not present

## 2020-02-14 DIAGNOSIS — Z13820 Encounter for screening for osteoporosis: Secondary | ICD-10-CM

## 2020-02-14 DIAGNOSIS — Z1159 Encounter for screening for other viral diseases: Secondary | ICD-10-CM

## 2020-02-14 MED ORDER — METOCLOPRAMIDE HCL 10 MG PO TABS: 10.0000 mg | ORAL_TABLET | Freq: Three times a day (TID) | ORAL | 0 refills | Status: AC

## 2020-02-14 NOTE — Progress Notes (Signed)
  Postoperative Follow-up Patient presents post op from operative hysteroscopy for abnormal uterine bleeding, 1 week ago.  Subjective: Patient reports marked improvement in her preop symptoms. Eating a regular diet without difficulty. The patient is not having any pain.  Activity: normal activities of daily living. Patient reports additional symptom's since surgery of abdominal bloating. Feels stuffed and eating only small amounts. She is having daily bowel movements. She has not had fevers. Denies abnormal discharge or bleeding.  Objective: BP 132/80   Ht 5\' 4"  (1.626 m)   Wt 190 lb (86.2 kg)   BMI 32.61 kg/m  Physical Exam Constitutional:      Appearance: She is well-developed.  Genitourinary:     Vagina and uterus normal.     No lesions in the vagina.     No cervical motion tenderness.     No right or left adnexal mass present.  HENT:     Head: Normocephalic and atraumatic.  Neck:     Thyroid: No thyromegaly.  Cardiovascular:     Rate and Rhythm: Normal rate and regular rhythm.     Heart sounds: Normal heart sounds.  Pulmonary:     Effort: Pulmonary effort is normal.     Breath sounds: Normal breath sounds.  Chest:     Breasts:        Right: No inverted nipple, mass, nipple discharge or skin change.        Left: No inverted nipple, mass, nipple discharge or skin change.  Abdominal:     General: Bowel sounds are normal. There is distension.     Palpations: Abdomen is soft. There is no mass.     Tenderness: There is no abdominal tenderness. There is no guarding or rebound.     Comments: Diminished bowl sounds  Musculoskeletal:     Cervical back: Neck supple.  Neurological:     Mental Status: She is alert and oriented to person, place, and time.  Skin:    General: Skin is warm and dry.  Psychiatric:        Behavior: Behavior normal.        Thought Content: Thought content normal.        Judgment: Judgment normal.  Vitals reviewed.   Assessment: s/p :  operative  hysteroscopy stable  Plan: Patient has done well after surgery with no apparent complications.  I have discussed the post-operative course to date, and the expected progress moving forward.  The patient understands what complications to be concerned about.  I will see the patient in routine follow up, or sooner if needed.    Will start Reglan for bloating, follow up if no improvement in 1 week.   Hep C testing today because of age Dexa Scan referral for osteoporosis screening.  Encouraged patient to receive PNA vaccine through PCP or [facility]  Activity plan: No restriction.    Ceniya Fowers R Iley Deignan 02/14/2020, 10:42 AM

## 2020-02-15 LAB — HEPATITIS C ANTIBODY: Hep C Virus Ab: <0.1 s/co ratio (ref 0.0–0.9)

## 2020-02-15 NOTE — Progress Notes (Signed)
Please call patient with normal lab results

## 2020-02-26 ENCOUNTER — Telehealth: Payer: Self-pay

## 2020-02-26 ENCOUNTER — Other Ambulatory Visit: Payer: Self-pay | Admitting: Obstetrics and Gynecology

## 2020-02-26 DIAGNOSIS — Z13820 Encounter for screening for osteoporosis: Secondary | ICD-10-CM

## 2020-02-26 NOTE — Telephone Encounter (Signed)
I believe I referred this patient last time I saw her for osteoporosis screening- do you guys know the status of that referral. Let me know if it needs to be reordered. Thank you.

## 2020-02-26 NOTE — Telephone Encounter (Signed)
Was able to reach pt and let her know that her Hep C results were negative. Pt pleased and she was asking if she was referred for a Dexa scan. I advised pt we usually call pt when we receive that referral from the provider. I have informed the pt that I will send a message to Dr.Schuman to see if she knows anything about this. Pt will call in a week or two if she has not been contacted

## 2020-02-26 NOTE — Telephone Encounter (Signed)
Sch DEXA at [facility] for 3/3 @ 10:20  Called pt to adv of new appt. She indicated that she will not be available for that date. I gave her the # [phone] to call and reschedule for a time that works best for her.

## 2020-02-28 ENCOUNTER — Other Ambulatory Visit: Payer: Medicare Other

## 2020-03-14 ENCOUNTER — Other Ambulatory Visit: Payer: Medicare Other

## 2020-03-28 ENCOUNTER — Ambulatory Visit
Admission: RE | Admit: 2020-03-28 | Discharge: 2020-03-28 | Disposition: A | Discharge status: Home / Self Care | Payer: Medicare Other | Source: Ambulatory Visit | Attending: Obstetrics and Gynecology | Admitting: Obstetrics and Gynecology

## 2020-03-28 DIAGNOSIS — M85851 Other specified disorders of bone density and structure, right thigh: Secondary | ICD-10-CM | POA: Diagnosis not present

## 2020-03-28 DIAGNOSIS — Z13820 Encounter for screening for osteoporosis: Secondary | ICD-10-CM | POA: Insufficient documentation

## 2020-04-19 ENCOUNTER — Telehealth: Payer: Self-pay

## 2020-04-19 NOTE — Telephone Encounter (Signed)
Pt calling to advise that she has not received her Bone Density results. She wants to inform Dr. Jerene Pitch that she is still having bloating of the stomach and constipation is still there. [phone]

## 2020-06-20 ENCOUNTER — Encounter: Payer: Self-pay | Admitting: Obstetrics and Gynecology

## 2020-06-20 ENCOUNTER — Ambulatory Visit (INDEPENDENT_AMBULATORY_CARE_PROVIDER_SITE_OTHER): Payer: Medicare Other | Admitting: Obstetrics and Gynecology

## 2020-06-20 ENCOUNTER — Other Ambulatory Visit: Payer: Self-pay

## 2020-06-20 VITALS — BP 132/70 | HR 76 | Resp 18 | Ht 64.0 in | Wt 194.6 lb

## 2020-06-20 DIAGNOSIS — M859 Disorder of bone density and structure, unspecified: Secondary | ICD-10-CM

## 2020-06-20 DIAGNOSIS — Z780 Asymptomatic menopausal state: Secondary | ICD-10-CM

## 2020-06-20 DIAGNOSIS — K5900 Constipation, unspecified: Secondary | ICD-10-CM

## 2020-06-20 DIAGNOSIS — M8580 Other specified disorders of bone density and structure, unspecified site: Secondary | ICD-10-CM

## 2020-06-20 DIAGNOSIS — R140 Abdominal distension (gaseous): Secondary | ICD-10-CM

## 2020-06-20 DIAGNOSIS — R300 Dysuria: Secondary | ICD-10-CM

## 2020-06-20 DIAGNOSIS — R1084 Generalized abdominal pain: Secondary | ICD-10-CM

## 2020-06-20 LAB — POCT URINALYSIS DIPSTICK
Bilirubin, UA: NEGATIVE
Blood, UA: NEGATIVE
Glucose, UA: NEGATIVE
Ketones, UA: NEGATIVE
Nitrite, UA: NEGATIVE
Protein, UA: NEGATIVE
Spec Grav, UA: 1.015 (ref 1.010–1.025)
Urobilinogen, UA: 0.2 E.U./dL
pH, UA: 5 (ref 5.0–8.0)

## 2020-06-20 NOTE — Progress Notes (Signed)
Gynecology Annual Exam  PCP: Dortha Kern, MD  Chief Complaint:  Chief Complaint  Patient presents with  . Gynecologic Exam    History of Present Illness: Patient is a 74 y.o. G1P1001 presents for annual exam. The patient has no complaints today.   LMP: No LMP recorded. Patient is postmenopausal.  She reports generalized abdominal discomfort and bloating and changes in bowel movements.  She is having pain with urination.  Review of Systems: ROS  Past Medical History:  Past Medical History:  Diagnosis Date  . Hypertension     Past Surgical History:  Past Surgical History:  Procedure Laterality Date  . CATARACT EXTRACTION, BILATERAL Bilateral   . CERVICAL POLYPECTOMY  02/06/2020   Procedure:   POLYPECTOMY;  Surgeon: Natale Milch, MD;  Location: [facility] ORS;  Service: Gynecology;;  . CHOLECYSTECTOMY    . COLONOSCOPY WITH PROPOFOL N/A 10/17/2015   Procedure: COLONOSCOPY WITH PROPOFOL;  Surgeon: Midge Minium, MD;  Location: [facility];  Service: Endoscopy;  Laterality: N/A;  . HYSTEROSCOPY WITH D & C N/A 02/06/2020   Procedure: DILATATION AND CURETTAGE /HYSTEROSCOPY;  Surgeon: Natale Milch, MD;  Location: [facility] OR[facility];  Service: Gynecology;  Laterality: N/A;  . POLYPECTOMY  10/17/2015   Procedure: POLYPECTOMY;  Surgeon: Midge Minium, MD;  Location: [facility];  Service: Endoscopy;;  cecum polyp  . ROTATOR CUFF REPAIR Right   . SHOULDER ARTHROSCOPY WITH OPEN ROTATOR CUFF REPAIR Left 03/18/2016   Procedure: SHOULDER ARTHROSCOPY WITH OPEN ROTATOR CUFF REPAIR;  Surgeon: Deeann Saint, MD;  Location: [facility] ORS;  Service: Orthopedics;  Laterality: Left;  . TONSILLECTOMY      Gynecologic History:  No LMP recorded. Patient is postmenopausal. Obstetric History: G1P1001  Family History:  Family History  Problem Relation Age of Onset  . Colon cancer Sister   . Breast cancer Neg Hx     Social History:  Social History   Socioeconomic History  .  Marital status: Widowed    Spouse name: Not on file  . Number of children: Not on file  . Years of education: Not on file  . Highest education level: Not on file  Occupational History  . Not on file  Tobacco Use  . Smoking status: Never Smoker  . Smokeless tobacco: Never Used  Vaping Use  . Vaping Use: Never used  Substance and Sexual Activity  . Alcohol use: No  . Drug use: No  . Sexual activity: Not Currently    Birth control/protection: Post-menopausal  Other Topics Concern  . Not on file  Social History Narrative  . Not on file   Social Determinants of Health   Financial Resource Strain: Not on file  Food Insecurity: Not on file  Transportation Needs: Not on file  Physical Activity: Not on file  Stress: Not on file  Social Connections: Not on file  Intimate Partner Violence: Not on file    Allergies:  Allergies  Allergen Reactions  . Hydrocodone Itching and Nausea And Vomiting  . Sulfa Antibiotics Other (See Comments)    headaches  . Penicillins Rash    Did it involve swelling of the face/tongue/throat, SOB, or low BP? No Did it involve sudden or severe rash/hives, skin peeling, or any reaction on the inside of your mouth or nose? Yes Did you need to seek medical attention at a hospital or doctor's office? Yes When did it last happen?~8 years ago If all above answers are "NO", may proceed with cephalosporin use.  Medications: Prior to Admission medications   Medication Sig Start Date End Date Taking? Authorizing Provider  acetaminophen (TYLENOL) 325 MG tablet Take 325 mg by mouth every 6 (six) hours as needed (for pain.).   Yes [provider]  amLODipine (NORVASC) 5 MG tablet Take 5 mg by mouth daily. AM   Yes [provider]  eszopiclone (LUNESTA) 2 MG TABS tablet Take 2 mg by mouth at bedtime as needed. 12/25/19  Yes [provider]  metoCLOPramide (REGLAN) 10 MG tablet Take 1 tablet (10 mg total) by mouth 3 (three) times  daily before meals. 02/14/20  Yes Shantoya Geurts R, MD  sertraline (ZOLOFT) 50 MG tablet Take 50 mg by mouth daily. Patient not taking: Reported on 01/24/2021 12/30/19  Yes [provider]  alendronate (FOSAMAX) 70 MG tablet Take 1 tablet (70 mg total) by mouth once a week. Take with a full glass of water on an empty stomach. 07/09/20   Alicen Donalson, Stefanie Libel, MD  PARoxetine (PAXIL) 20 MG tablet Take 1 tablet (20 mg total) by mouth daily. 07/09/20   Homero Fellers, MD    Physical Exam Vitals: Blood pressure 132/70, pulse 76, resp. rate 18, height 5\' 4"  (1.626 m), weight 194 lb 9.6 oz (88.3 kg), SpO2 97 %.  OBGyn Exam   Female chaperone present for pelvic and breast  portions of the physical exam  Assessment: 74 y.o. G1P1001 routine annual exam  Plan: Problem List Items Addressed This Visit   None   Visit Diagnoses    Osteopenia after menopause    -  Primary   Relevant Orders   CBC (Completed)   Comprehensive metabolic panel (Completed)   TSH + free T4 (Completed)   Calcium, Urine, 24 Hour (Completed)   Vitamin D (25 hydroxy) (Completed)   POCT Urinalysis Dipstick (Completed)   Bloating       Relevant Orders   Ambulatory referral to Gastroenterology   CT ABDOMEN PELVIS W CONTRAST (Completed)   POCT Urinalysis Dipstick (Completed)   Constipation, unspecified constipation type       Relevant Orders   Ambulatory referral to Gastroenterology   CT ABDOMEN PELVIS W CONTRAST (Completed)   POCT Urinalysis Dipstick (Completed)   Disorder of bone density and structure, unspecified        Relevant Orders   Vitamin D (25 hydroxy) (Completed)   POCT Urinalysis Dipstick (Completed)   Generalized abdominal pain       Relevant Orders   CT ABDOMEN PELVIS W CONTRAST (Completed)   POCT Urinalysis Dipstick (Completed)   Dysuria       Relevant Orders   POCT Urinalysis Dipstick (Completed)      1) Mammogram - recommend yearly screening mammogram.  Mammogram Is up to  date  2) STI screening was offered and declined  3) ASCCP guidelines and rational discussed.  Patient opts for Discontinue screening interval  4) Colonoscopy -- referral placed.   5) Routine healthcare maintenance including cholesterol, diabetes screening discussed managed by PCP  6) Osteoporosis- labs today, reviewed treatment options  Osteoporosis GI referral CT for bloating and change in bowel movmenets Pain with urination  Adrian Prows MD, [place], [facility] 04/23/2021 11:56 AM

## 2020-06-20 NOTE — Patient Instructions (Addendum)
Institute of Medicine Recommended Dietary Allowances for Calcium and Vitamin D  Age (yr) Calcium Recommended Dietary Allowance (mg/day) Vitamin D Recommended Dietary Allowance (international units/day)  9-18 1,300 600  19-50 1,000 600  51-70 1,200 600  71 and older 1,200 800  Data from Institute of Medicine. Dietary reference intakes: calcium, vitamin D. [place], [place]: [employer]; 2011.    Danton Clap, MD  Osteopenia  Osteopenia is a loss of thickness (density) inside of the bones. Another name for osteopenia is low bone mass. Mild osteopenia is a normal part of aging. It is not a disease, and it does not cause symptoms. However, if you have osteopenia and continue to lose bone mass, you could develop a condition that causes the bones to become thin and break more easily (osteoporosis). You may also lose some height, have back pain, and have a stooped posture. Although osteopenia is not a disease, making changes to your lifestyle and diet can help to prevent osteopenia from developing into osteoporosis. What are the causes? Osteopenia is caused by loss of calcium in the bones.  Bones are constantly changing. Old bone cells are continually being replaced with new bone cells. This process builds new bone. The mineral calcium is needed to build new bone and maintain bone density. Bone density is usually highest around age 31. After that, most people's bodies cannot replace all the bone they have lost with new bone. What increases the risk? You are more likely to develop this condition if:  You are older than age 49.  You are a woman who went through menopause early.  You have a long illness that keeps you in bed.  You do not get enough exercise.  You lack certain nutrients (malnutrition).  You have an overactive thyroid gland (hyperthyroidism).  You smoke.  You drink a lot of alcohol.  You are taking medicines that weaken the bones, such as  steroids. What are the signs or symptoms? This condition does not cause any symptoms. You may have a slightly higher risk for bone breaks (fractures), so getting fractures more easily than normal may be an indication of osteopenia. How is this diagnosed? Your health care provider can diagnose this condition with a special type of X-ray exam that measures bone density (dual-energy X-ray absorptiometry, DEXA). This test can measure bone density in your hips, spine, and wrists. Osteopenia has no symptoms, so this condition is usually diagnosed after a routine bone density screening test is done for osteoporosis. This routine screening is usually done for:  Women who are age 67 or older.  Men who are age 20 or older. If you have risk factors for osteopenia, you may have the screening test at an earlier age. How is this treated? Making dietary and lifestyle changes can lower your risk for osteoporosis. If you have severe osteopenia that is close to becoming osteoporosis, your health care provider may prescribe medicines and dietary supplements such as calcium and vitamin D. These supplements help to rebuild bone density. Follow these instructions at home:   Take over-the-counter and prescription medicines only as told by your health care provider. These include vitamins and supplements.  Eat a diet that is high in calcium and vitamin D. ? Calcium is found in dairy products, beans, salmon, and leafy green vegetables like spinach and broccoli. ? Look for foods that have vitamin D and calcium added to them (fortified foods), such as orange juice, cereal, and bread.  Do 30 or more minutes of  a weight-bearing exercise every day, such as walking, jogging, or playing a sport. These types of exercises strengthen the bones.  Take precautions at home to lower your risk of falling, such as: ? Keeping rooms well-lit and free of clutter, such as cords. ? Installing safety rails on stairs. ? Using rubber  mats in the bathroom or other areas that are often wet or slippery.  Do not use any products that contain nicotine or tobacco, such as cigarettes and e-cigarettes. If you need help quitting, ask your health care provider.  Avoid alcohol or limit alcohol intake to no more than 1 drink a day for nonpregnant women and 2 drinks a day for men. One drink equals 12 oz of beer, 5 oz of wine, or 1 oz of hard liquor.  Keep all follow-up visits as told by your health care provider. This is important. Contact a health care provider if:  You have not had a bone density screening for osteoporosis and you are: ? A woman, age 57 or older. ? A man, age 40 or older.  You are a postmenopausal woman who has not had a bone density screening for osteoporosis.  You are older than age 94 and you want to know if you should have bone density screening for osteoporosis. Summary  Osteopenia is a loss of thickness (density) inside of the bones. Another name for osteopenia is low bone mass.  Osteopenia is not a disease, but it may increase your risk for a condition that causes the bones to become thin and break more easily (osteoporosis).  You may be at risk for osteopenia if you are older than age 13 or if you are a woman who went through early menopause.  Osteopenia does not cause any symptoms, but it can be diagnosed with a bone density screening test.  Dietary and lifestyle changes are the first treatment for osteopenia. These may lower your risk for osteoporosis. This information is not intended to replace advice given to you by your health care provider. Make sure you discuss any questions you have with your health care provider. Document Revised: 11/26/2017 Document Reviewed: 09/22/2017 Elsevier Patient Education  2020 [place] protect organs, store calcium, anchor muscles, and support the whole body. Keeping your bones strong is important, especially as you get older. You can  take actions to help keep your bones strong and healthy. Why is keeping my bones healthy important?  Keeping your bones healthy is important because your body constantly replaces bone cells. Cells get old, and new cells take their place. As we age, we lose bone cells because the body may not be able to make enough new cells to replace the old cells. The amount of bone cells and bone tissue you have is referred to as bone mass. The higher your bone mass, the stronger your bones. The aging process leads to an overall loss of bone mass in the body, which can increase the likelihood of:  Joint pain and stiffness.  Broken bones.  A condition in which the bones become weak and brittle (osteoporosis). A large decline in bone mass occurs in older adults. In women, it occurs about the time of menopause. What actions can I take to keep my bones healthy? Good health habits are important for maintaining healthy bones. This includes eating nutritious foods and exercising regularly. To have healthy bones, you need to get enough of the right minerals and vitamins. Most nutrition experts recommend getting these nutrients  from the foods that you eat. In some cases, taking supplements may also be recommended. Doing certain types of exercise is also important for bone health. What are the nutritional recommendations for healthy bones?  Eating a well-balanced diet with plenty of calcium and vitamin D will help to protect your bones. Nutritional recommendations vary from person to person. Ask your health care provider what is healthy for you. Here are some general guidelines. Get enough calcium Calcium is the most important (essential) mineral for bone health. Most people can get enough calcium from their diet, but supplements may be recommended for people who are at risk for osteoporosis. Good sources of calcium include:  Dairy products, such as low-fat or nonfat milk, cheese, and yogurt.  Dark green leafy  vegetables, such as bok choy and broccoli.  Calcium-fortified foods, such as orange juice, cereal, bread, soy beverages, and tofu products.  Nuts, such as almonds. Follow these recommended amounts for daily calcium intake:  Children, age 74-3: 700 mg.  Children, age 56-8: 1,000 mg.  Children, age 53-13: 1,300 mg.  Teens, age 9-18: 1,300 mg.  Adults, age 72-50: 1,000 mg.  Adults, age 561-70: ? Men: 1,000 mg. ? Women: 1,200 mg.  Adults, age 6 or older: 1,200 mg.  Pregnant and breastfeeding females: ? Teens: 1,300 mg. ? Adults: 1,000 mg. Get enough vitamin D Vitamin D is the most essential vitamin for bone health. It helps the body absorb calcium. Sunlight stimulates the skin to make vitamin D, so be sure to get enough sunlight. If you live in a cold climate or you do not get outside often, your health care provider may recommend that you take vitamin D supplements. Good sources of vitamin D in your diet include:  Egg yolks.  Saltwater fish.  Milk and cereal fortified with vitamin D. Follow these recommended amounts for daily vitamin D intake:  Children and teens, age 74-18: 600 international units.  Adults, age 31 or younger: 400-800 international units.  Adults, age 54 or older: 800-1,000 international units. Get other important nutrients Other nutrients that are important for bone health include:  Phosphorus. This mineral is found in meat, poultry, dairy foods, nuts, and legumes. The recommended daily intake for adult men and adult women is 700 mg.  Magnesium. This mineral is found in seeds, nuts, dark green vegetables, and legumes. The recommended daily intake for adult men is 400-420 mg. For adult women, it is 310-320 mg.  Vitamin K. This vitamin is found in green leafy vegetables. The recommended daily intake is 120 mg for adult men and 90 mg for adult women. What type of physical activity is best for building and maintaining healthy bones? Weight-bearing and  strength-building activities are important for building and maintaining healthy bones. Weight-bearing activities cause muscles and bones to work against gravity. Strength-building activities increase the strength of the muscles that support bones. Weight-bearing and muscle-building activities include:  Walking and hiking.  Jogging and running.  Dancing.  Gym exercises.  Lifting weights.  Tennis and racquetball.  Climbing stairs.  Aerobics. Adults should get at least 30 minutes of moderate physical activity on most days. Children should get at least 60 minutes of moderate physical activity on most days. Ask your health care provider what type of exercise is best for you. How can I find out if my bone mass is low? Bone mass can be measured with an X-ray test called a bone mineral density (BMD) test. This test is recommended for all women who are  age 79 or older. It may also be recommended for:  Men who are age 66 or older.  People who are at risk for osteoporosis because of: ? Having bones that break easily. ? Having a long-term disease that weakens bones, such as kidney disease or rheumatoid arthritis. ? Having menopause earlier than normal. ? Taking medicine that weakens bones, such as steroids, thyroid hormones, or hormone treatment for breast cancer or prostate cancer. ? Smoking. ? Drinking three or more alcoholic drinks a day. If you find that you have a low bone mass, you may be able to prevent osteoporosis or further bone loss by changing your diet and lifestyle. Where can I find more information? For more information, check out the following websites:  National Osteoporosis Foundation: https://carlson-fletcher.info/  [employer]: www.bones.http://www.myers.net/  International Osteoporosis Foundation: Investment banker, operational.iofbonehealth.org Summary  The aging process leads to an overall loss of bone mass in the body, which can increase the likelihood of broken bones and osteoporosis.  Eating  a well-balanced diet with plenty of calcium and vitamin D will help to protect your bones.  Weight-bearing and strength-building activities are also important for building and maintaining strong bones.  Bone mass can be measured with an X-ray test called a bone mineral density (BMD) test. This information is not intended to replace advice given to you by your health care provider. Make sure you discuss any questions you have with your health care provider. Document Revised: 01/10/2018 Document Reviewed: 01/10/2018 Elsevier Patient Education  2020 [employer].

## 2020-06-21 ENCOUNTER — Telehealth: Payer: Self-pay | Admitting: Obstetrics and Gynecology

## 2020-06-21 LAB — COMPREHENSIVE METABOLIC PANEL
ALT: 16 IU/L (ref 0–32)
AST: 14 IU/L (ref 0–40)
Albumin/Globulin Ratio: 1.7 (ref 1.2–2.2)
Albumin: 4.5 g/dL (ref 3.7–4.7)
Alkaline Phosphatase: 92 IU/L (ref 48–121)
BUN/Creatinine Ratio: 11 — ABNORMAL LOW (ref 12–28)
BUN: 10 mg/dL (ref 8–27)
Bilirubin Total: 0.5 mg/dL (ref 0.0–1.2)
CO2: 27 mmol/L (ref 20–29)
Calcium: 10.6 mg/dL — ABNORMAL HIGH (ref 8.7–10.3)
Chloride: 101 mmol/L (ref 96–106)
Creatinine, Ser: 0.87 mg/dL (ref 0.57–1.00)
GFR calc Af Amer: 76 mL/min/{1.73_m2} (ref >59)
GFR calc non Af Amer: 66 mL/min/{1.73_m2} (ref >59)
Globulin, Total: 2.6 g/dL (ref 1.5–4.5)
Glucose: 74 mg/dL (ref 65–99)
Potassium: 4.5 mmol/L (ref 3.5–5.2)
Sodium: 141 mmol/L (ref 134–144)
Total Protein: 7.1 g/dL (ref 6.0–8.5)

## 2020-06-21 LAB — TSH+FREE T4
Free T4: 0.93 ng/dL (ref 0.82–1.77)
TSH: 1.85 u[IU]/mL (ref 0.450–4.500)

## 2020-06-21 LAB — VITAMIN D 25 HYDROXY (VIT D DEFICIENCY, FRACTURES): Vit D, 25-Hydroxy: 31.9 ng/mL (ref 30.0–100.0)

## 2020-06-21 LAB — CBC
Hematocrit: 42.2 % (ref 34.0–46.6)
Hemoglobin: 13.9 g/dL (ref 11.1–15.9)
MCH: 29.3 pg (ref 26.6–33.0)
MCHC: 32.9 g/dL (ref 31.5–35.7)
MCV: 89 fL (ref 79–97)
Platelets: 216 10*3/uL (ref 150–450)
RBC: 4.74 x10E6/uL (ref 3.77–5.28)
RDW: 13.8 % (ref 11.7–15.4)
WBC: 8 10*3/uL (ref 3.4–10.8)

## 2020-06-21 NOTE — Telephone Encounter (Signed)
Pt calling about rx not been sent in yet. Please advise

## 2020-06-24 ENCOUNTER — Telehealth: Payer: Self-pay | Admitting: Obstetrics and Gynecology

## 2020-06-24 ENCOUNTER — Other Ambulatory Visit: Payer: Self-pay | Admitting: Obstetrics and Gynecology

## 2020-06-24 DIAGNOSIS — R300 Dysuria: Secondary | ICD-10-CM

## 2020-06-24 MED ORDER — NITROFURANTOIN MONOHYD MACRO 100 MG PO CAPS: 100.0000 mg | ORAL_CAPSULE | Freq: Two times a day (BID) | ORAL | 0 refills | Status: AC

## 2020-06-24 NOTE — Telephone Encounter (Signed)
Patient seen on 6/24, was supposed to have Rx sent into pharmacy, as of today they still do not have an Rx for her, please resend.  [facility] [place] [facility].

## 2020-06-24 NOTE — Telephone Encounter (Signed)
Sent- please let patient know, thank you

## 2020-06-25 LAB — CALCIUM, URINE, 24 HOUR
Calcium, 24H Urine: 393 mg/24 hr — ABNORMAL HIGH (ref 26–354)
Calcium, Urine: 20.7 mg/dL

## 2020-06-28 ENCOUNTER — Ambulatory Visit
Admission: RE | Admit: 2020-06-28 | Discharge: 2020-06-28 | Disposition: A | Discharge status: Home / Self Care | Payer: Medicare Other | Source: Ambulatory Visit | Attending: Obstetrics and Gynecology | Admitting: Obstetrics and Gynecology

## 2020-06-28 ENCOUNTER — Other Ambulatory Visit: Payer: Self-pay

## 2020-06-28 DIAGNOSIS — K5900 Constipation, unspecified: Secondary | ICD-10-CM | POA: Insufficient documentation

## 2020-06-28 DIAGNOSIS — R140 Abdominal distension (gaseous): Secondary | ICD-10-CM | POA: Diagnosis present

## 2020-06-28 DIAGNOSIS — R1084 Generalized abdominal pain: Secondary | ICD-10-CM

## 2020-06-28 MED ORDER — IOHEXOL 300 MG/ML  SOLN
100.0000 mL | Freq: Once | INTRAMUSCULAR | Status: AC | PRN
  Administered 2020-06-28: 100 mL via INTRAVENOUS

## 2020-07-02 NOTE — Telephone Encounter (Signed)
Spoke w/patient to verify she did get the rx and has finished it. Advised I was out of the office and just getting this message.

## 2020-07-09 ENCOUNTER — Other Ambulatory Visit: Payer: Self-pay

## 2020-07-09 ENCOUNTER — Ambulatory Visit (INDEPENDENT_AMBULATORY_CARE_PROVIDER_SITE_OTHER): Payer: Medicare Other | Admitting: Obstetrics and Gynecology

## 2020-07-09 ENCOUNTER — Encounter: Payer: Self-pay | Admitting: Obstetrics and Gynecology

## 2020-07-09 VITALS — BP 134/72 | Resp 18 | Ht 64.0 in | Wt 195.4 lb

## 2020-07-09 DIAGNOSIS — M8580 Other specified disorders of bone density and structure, unspecified site: Secondary | ICD-10-CM

## 2020-07-09 DIAGNOSIS — E559 Vitamin D deficiency, unspecified: Secondary | ICD-10-CM | POA: Diagnosis not present

## 2020-07-09 DIAGNOSIS — F32A Depression, unspecified: Secondary | ICD-10-CM

## 2020-07-09 DIAGNOSIS — F320 Major depressive disorder, single episode, mild: Secondary | ICD-10-CM | POA: Diagnosis not present

## 2020-07-09 DIAGNOSIS — Z9189 Other specified personal risk factors, not elsewhere classified: Secondary | ICD-10-CM | POA: Diagnosis not present

## 2020-07-09 MED ORDER — ALENDRONATE SODIUM 70 MG PO TABS: 70.0000 mg | ORAL_TABLET | ORAL | 11 refills | Status: DC

## 2020-07-09 MED ORDER — PAROXETINE HCL 20 MG PO TABS: 20.0000 mg | ORAL_TABLET | Freq: Every day | ORAL | 11 refills | Status: DC

## 2020-07-09 NOTE — Patient Instructions (Addendum)
Institute of Medicine Recommended Dietary Allowances for Calcium and Vitamin D  Age (yr) Calcium Recommended Dietary Allowance (mg/day) Vitamin D Recommended Dietary Allowance (international units/day)  9-18 1,300 600  19-50 1,000 600  51-70 1,200 600  71 and older 1,200 800- 1000  Data from Institute of Medicine. Dietary reference intakes: calcium, vitamin D. [place], [place]: [employer]; 2011.    Bone Health Bones protect organs, store calcium, anchor muscles, and support the whole body. Keeping your bones strong is important, especially as you get older. You can take actions to help keep your bones strong and healthy. Why is keeping my bones healthy important?  Keeping your bones healthy is important because your body constantly replaces bone cells. Cells get old, and new cells take their place. As we age, we lose bone cells because the body may not be able to make enough new cells to replace the old cells. The amount of bone cells and bone tissue you have is referred to as bone mass. The higher your bone mass, the stronger your bones. The aging process leads to an overall loss of bone mass in the body, which can increase the likelihood of:  Joint pain and stiffness.  Broken bones.  A condition in which the bones become weak and brittle (osteoporosis). A large decline in bone mass occurs in older adults. In women, it occurs about the time of menopause. What actions can I take to keep my bones healthy? Good health habits are important for maintaining healthy bones. This includes eating nutritious foods and exercising regularly. To have healthy bones, you need to get enough of the right minerals and vitamins. Most nutrition experts recommend getting these nutrients from the foods that you eat. In some cases, taking supplements may also be recommended. Doing certain types of exercise is also important for bone health. What are the nutritional recommendations for healthy  bones?  Eating a well-balanced diet with plenty of calcium and vitamin D will help to protect your bones. Nutritional recommendations vary from person to person. Ask your health care provider what is healthy for you. Here are some general guidelines. Get enough calcium Calcium is the most important (essential) mineral for bone health. Most people can get enough calcium from their diet, but supplements may be recommended for people who are at risk for osteoporosis. Good sources of calcium include:  Dairy products, such as low-fat or nonfat milk, cheese, and yogurt.  Dark green leafy vegetables, such as bok choy and broccoli.  Calcium-fortified foods, such as orange juice, cereal, bread, soy beverages, and tofu products.  Nuts, such as almonds. Follow these recommended amounts for daily calcium intake:  Children, age 22-3: 700 mg.  Children, age 41-8: 1,000 mg.  Children, age 413-13: 1,300 mg.  Teens, age 30-18: 1,300 mg.  Adults, age 28-50: 1,000 mg.  Adults, age 412-70: ? Men: 1,000 mg. ? Women: 1,200 mg.  Adults, age 23 or older: 1,200 mg.  Pregnant and breastfeeding females: ? Teens: 1,300 mg. ? Adults: 1,000 mg. Get enough vitamin D Vitamin D is the most essential vitamin for bone health. It helps the body absorb calcium. Sunlight stimulates the skin to make vitamin D, so be sure to get enough sunlight. If you live in a cold climate or you do not get outside often, your health care provider may recommend that you take vitamin D supplements. Good sources of vitamin D in your diet include:  Egg yolks.  Saltwater fish.  Milk and cereal fortified with  vitamin D. Follow these recommended amounts for daily vitamin D intake:  Children and teens, age 72-18: 600 international units.  Adults, age 26 or younger: 400-800 international units.  Adults, age 724 or older: 800-1,000 international units. Get other important nutrients Other nutrients that are important for bone health  include:  Phosphorus. This mineral is found in meat, poultry, dairy foods, nuts, and legumes. The recommended daily intake for adult men and adult women is 700 mg.  Magnesium. This mineral is found in seeds, nuts, dark green vegetables, and legumes. The recommended daily intake for adult men is 400-420 mg. For adult women, it is 310-320 mg.  Vitamin K. This vitamin is found in green leafy vegetables. The recommended daily intake is 120 mg for adult men and 90 mg for adult women. What type of physical activity is best for building and maintaining healthy bones? Weight-bearing and strength-building activities are important for building and maintaining healthy bones. Weight-bearing activities cause muscles and bones to work against gravity. Strength-building activities increase the strength of the muscles that support bones. Weight-bearing and muscle-building activities include:  Walking and hiking.  Jogging and running.  Dancing.  Gym exercises.  Lifting weights.  Tennis and racquetball.  Climbing stairs.  Aerobics. Adults should get at least 30 minutes of moderate physical activity on most days. Children should get at least 60 minutes of moderate physical activity on most days. Ask your health care provider what type of exercise is best for you. How can I find out if my bone mass is low? Bone mass can be measured with an X-ray test called a bone mineral density (BMD) test. This test is recommended for all women who are age 6 or older. It may also be recommended for:  Men who are age 4 or older.  People who are at risk for osteoporosis because of: ? Having bones that break easily. ? Having a long-term disease that weakens bones, such as kidney disease or rheumatoid arthritis. ? Having menopause earlier than normal. ? Taking medicine that weakens bones, such as steroids, thyroid hormones, or hormone treatment for breast cancer or prostate cancer. ? Smoking. ? Drinking three or  more alcoholic drinks a day. If you find that you have a low bone mass, you may be able to prevent osteoporosis or further bone loss by changing your diet and lifestyle. Where can I find more information? For more information, check out the following websites:  National Osteoporosis Foundation: https://carlson-fletcher.info/  [employer]: www.bones.http://www.myers.net/  International Osteoporosis Foundation: Investment banker, operational.iofbonehealth.org Summary  The aging process leads to an overall loss of bone mass in the body, which can increase the likelihood of broken bones and osteoporosis.  Eating a well-balanced diet with plenty of calcium and vitamin D will help to protect your bones.  Weight-bearing and strength-building activities are also important for building and maintaining strong bones.  Bone mass can be measured with an X-ray test called a bone mineral density (BMD) test. This information is not intended to replace advice given to you by your health care provider. Make sure you discuss any questions you have with your health care provider. Document Revised: 01/10/2018 Document Reviewed: 01/10/2018 Elsevier Patient Education  2020 [employer].   [facility] [place] Same Day Access Hours:  Monday, Wednesday and Friday, 8am - 3pm Walk-In Crisis Hours: 7 days/wk, 8am - 8pm [address] [place], [place] [address] [phone]  [employer] [phone]  Mobile Crisis Unit [phone]   Therapists/Counselors/Psychologists  Cari  [facility] [address] [place], [place] [address] [phone]  Karen [place], [place]  & Jacqlyn Krauss [place]    [phone]        [address]       [place], [place] [address]        Ival Bible, CSW [phone] [address] [place], [place] [address]  Harle Battiest, [place]        [phone]        [address]      [place], [place]  [address]        Chyrel Masson, MS [phone] [address] [place], [place] [address]   Oscar La, LMFT       [phone]        [address]       [place], [place] [address]        Natania Finigan [phone] [address] [place], [place] [address]  Lester Jane Lew        [phone]        [address]       [place], [place] [address]        Kerin Salen [phone] [address] [place], [place] [address]  Tyron Russell, PsyD       [phone]        [address]       [place], [place] [address]        Elita Quick, LPC [phone] [address]  [place], [place] [address]   Debarah Crape        [phone]        [address] [place], [place] [address]         Rosana Hoes [facility] [phone] [email or web address]   Sation Konchella       [phone]        [address]       [place], [place] [address]        Morton Stall [facility] [phone] [email or web address]

## 2020-07-09 NOTE — Progress Notes (Signed)
Patient ID: Brianna [name], female   DOB: 1946-05-07, 74 y.o.   MRN: [id number]  Reason for Consult: Gynecologic Exam   Referred by Brianna Kern, MD  Subjective:     HPI:  Brianna [name] is a 74 y.o. female. She is following up today after her CT scan which was normal and her labs for osteopopenia which showed Vitamin D deficiency.  She reports that she continues to have symptoms of bloating. Her urinary complaints resolved with taking macrobid. She has new concerns regarding pain in her ankles.  She additionally reports having some hot flashes after discontinuing her HRT.  She also reports she has been caring for her husband and this stress has lead to depression and anxiety. She took zoloft in the past and would like to restart a SSRI.   Past Medical History:  Diagnosis Date  . Hypertension    Family History  Problem Relation Age of Onset  . Colon cancer Sister   . Breast cancer Neg Hx    Past Surgical History:  Procedure Laterality Date  . CATARACT EXTRACTION, BILATERAL Bilateral   . CERVICAL POLYPECTOMY  02/06/2020   Procedure:   POLYPECTOMY;  Surgeon: Natale Milch, MD;  Location: [facility];  Service: Gynecology;;  . CHOLECYSTECTOMY    . COLONOSCOPY WITH PROPOFOL N/A 10/17/2015   Procedure: COLONOSCOPY WITH PROPOFOL;  Surgeon: Midge Minium, MD;  Location: [facility];  Service: Endoscopy;  Laterality: N/A;  . HYSTEROSCOPY WITH D & C N/A 02/06/2020   Procedure: DILATATION AND CURETTAGE /HYSTEROSCOPY;  Surgeon: Natale Milch, MD;  Location: [facility];  Service: Gynecology;  Laterality: N/A;  . POLYPECTOMY  10/17/2015   Procedure: POLYPECTOMY;  Surgeon: Midge Minium, MD;  Location: [facility];  Service: Endoscopy;;  cecum polyp  . ROTATOR CUFF REPAIR Right   . SHOULDER ARTHROSCOPY WITH OPEN ROTATOR CUFF REPAIR Left 03/18/2016   Procedure: SHOULDER ARTHROSCOPY WITH OPEN ROTATOR CUFF REPAIR;  Surgeon: Deeann Saint, MD;  Location: [facility];   Service: Orthopedics;  Laterality: Left;  . TONSILLECTOMY      Short Social History:  Social History   Tobacco Use  . Smoking status: Never Smoker  . Smokeless tobacco: Never Used  Substance Use Topics  . Alcohol use: No    Allergies  Allergen Reactions  . Hydrocodone Itching and Nausea And Vomiting  . Sulfa Antibiotics Other (See Comments)    headaches  . Penicillins Rash    Did it involve swelling of the face/tongue/throat, SOB, or low BP? No Did it involve sudden or severe rash/hives, skin peeling, or any reaction on the inside of your mouth or nose? Yes Did you need to seek medical attention at a hospital or doctor's office? Yes When did it last happen?~8 years ago If all above answers are "NO", may proceed with cephalosporin use.     Current Outpatient Medications  Medication Sig Dispense Refill  . acetaminophen (TYLENOL) 325 MG tablet Take 325 mg by mouth every 6 (six) hours as needed (for pain.).    Marland Kitchen alendronate (FOSAMAX) 70 MG tablet Take 1 tablet (70 mg total) by mouth once a week. Take with a full glass of water on an empty stomach. 30 tablet 11  . amLODipine (NORVASC) 5 MG tablet Take 5 mg by mouth daily. AM    . eszopiclone (LUNESTA) 2 MG TABS tablet Take 2 mg by mouth at bedtime as needed.    . metoCLOPramide (REGLAN) 10 MG tablet Take 1 tablet (  10 mg total) by mouth 3 (three) times daily before meals. 30 tablet 0  . PARoxetine (PAXIL) 20 MG tablet Take 1 tablet (20 mg total) by mouth daily. 30 tablet 11  . Polyethyl Glycol-Propyl Glycol (SYSTANE) 0.4-0.3 % SOLN Place 1-2 drops into both eyes 3 (three) times daily as needed (dry/irritated eyes.).    Marland Kitchen sertraline (ZOLOFT) 50 MG tablet Take 50 mg by mouth daily.     No current facility-administered medications for this visit.    Review of Systems  Constitutional: Negative for chills, fatigue, fever and unexpected weight change.  HENT: Negative for trouble swallowing.  Eyes: Negative for loss of vision.    Respiratory: Negative for cough, shortness of breath and wheezing.  Cardiovascular: Negative for chest pain, leg swelling, palpitations and syncope.  GI: Negative for abdominal pain, blood in stool, diarrhea, nausea and vomiting.  GU: Negative for difficulty urinating, dysuria, frequency and hematuria.  Musculoskeletal: Negative for back pain, leg pain and joint pain.  Skin: Negative for rash.  Neurological: Negative for dizziness, headaches, light-headedness, numbness and seizures.  Psychiatric: Negative for behavioral problem, confusion, depressed mood and sleep disturbance.        Objective:  Objective   Vitals:   07/09/20 1634  BP: 134/72  Resp: 18  Weight: 195 lb 6.4 oz (88.6 kg)  Height: 5\' 4"  (1.626 m)   Body mass index is 33.54 kg/m.  Physical Exam Vitals and nursing note reviewed.  Constitutional:      Appearance: She is well-developed.  HENT:     Head: Normocephalic and atraumatic.  Eyes:     Pupils: Pupils are equal, round, and reactive to light.  Cardiovascular:     Rate and Rhythm: Normal rate and regular rhythm.  Pulmonary:     Effort: Pulmonary effort is normal. No respiratory distress.  Skin:    General: Skin is warm and dry.  Neurological:     Mental Status: She is alert and oriented to person, place, and time.  Psychiatric:        Behavior: Behavior normal.        Thought Content: Thought content normal.        Judgment: Judgment normal.         Assessment/Plan:    74 yo  Has not seen GI yet. CT was normal. Encouraged patient to follow up. Complaints of depression related to caring for significant other, would like to restart a SSRI. GAD-7 21; PHQ-9: 19 Having some moderate to severe hot flashe since discontinuing her HRT. Will start Paxil.  Will start osteoporosis treatment with Fosamax weekly- repeat DEXA scan in 2 years.   More than 30 minutes were spent face to face with the patient in the room, reviewing the medical record, labs and  images, and coordinating care for the patient. The plan of management was discussed in detail and counseling was provided.     66 MD [facility], [facility] 07/09/2020 5:25 PM

## 2020-10-09 ENCOUNTER — Ambulatory Visit (INDEPENDENT_AMBULATORY_CARE_PROVIDER_SITE_OTHER): Payer: Medicare Other | Admitting: Obstetrics and Gynecology

## 2020-10-09 ENCOUNTER — Other Ambulatory Visit: Payer: Self-pay

## 2020-10-09 VITALS — BP 120/74 | Ht 64.0 in | Wt 198.2 lb

## 2020-10-09 DIAGNOSIS — M5030 Other cervical disc degeneration, unspecified cervical region: Secondary | ICD-10-CM | POA: Insufficient documentation

## 2020-10-09 DIAGNOSIS — F32A Depression, unspecified: Secondary | ICD-10-CM

## 2020-10-09 DIAGNOSIS — F320 Major depressive disorder, single episode, mild: Secondary | ICD-10-CM

## 2021-01-13 ENCOUNTER — Ambulatory Visit: Payer: Medicare Other | Admitting: Obstetrics and Gynecology

## 2021-01-17 ENCOUNTER — Ambulatory Visit: Payer: Medicare Other | Admitting: Obstetrics and Gynecology

## 2021-01-24 ENCOUNTER — Encounter: Payer: Self-pay | Admitting: Obstetrics and Gynecology

## 2021-01-24 ENCOUNTER — Ambulatory Visit (INDEPENDENT_AMBULATORY_CARE_PROVIDER_SITE_OTHER): Payer: Medicare Other | Admitting: Obstetrics and Gynecology

## 2021-01-24 ENCOUNTER — Other Ambulatory Visit: Payer: Self-pay

## 2021-01-24 VITALS — BP 128/70 | Ht 64.0 in | Wt 196.2 lb

## 2021-01-24 DIAGNOSIS — F320 Major depressive disorder, single episode, mild: Secondary | ICD-10-CM | POA: Diagnosis not present

## 2021-01-24 DIAGNOSIS — F32A Depression, unspecified: Secondary | ICD-10-CM

## 2021-01-24 NOTE — Progress Notes (Signed)
Patient ID: TEEGHAN [name], female   DOB: [birth year], 75 y.o.   MRN: [id number]  Reason for Consult: Gynecologic Exam   Referred by Dortha Kern, MD  Subjective:     HPI:  Brianna [name] is a 75 y.o. female. She is following up regarding depression and anxiety. Her partner who she was caring for recently passed away. It was a heard event since he had a heart attack in their home and was dead in the house for over 4 hours. She does feel like she has been having a great improvement in her mood being on the Paxil. She also reports that her hot flashes have resolved with this medication.   phq-9: 3 GAD-7: 5   Past Medical History:  Diagnosis Date  . Hypertension    Family History  Problem Relation Age of Onset  . Colon cancer Sister   . Breast cancer Neg Hx    Past Surgical History:  Procedure Laterality Date  . CATARACT EXTRACTION, BILATERAL Bilateral   . CERVICAL POLYPECTOMY  02/06/2020   Procedure:   POLYPECTOMY;  Surgeon: Natale Milch, MD;  Location: [facility];  Service: Gynecology;;  . CHOLECYSTECTOMY    . COLONOSCOPY WITH PROPOFOL N/A 10/17/2015   Procedure: COLONOSCOPY WITH PROPOFOL;  Surgeon: Midge Minium, MD;  Location: [facility];  Service: Endoscopy;  Laterality: N/A;  . HYSTEROSCOPY WITH D & C N/A 02/06/2020   Procedure: DILATATION AND CURETTAGE /HYSTEROSCOPY;  Surgeon: Natale Milch, MD;  Location: [facility];  Service: Gynecology;  Laterality: N/A;  . POLYPECTOMY  10/17/2015   Procedure: POLYPECTOMY;  Surgeon: Midge Minium, MD;  Location: [facility];  Service: Endoscopy;;  cecum polyp  . ROTATOR CUFF REPAIR Right   . SHOULDER ARTHROSCOPY WITH OPEN ROTATOR CUFF REPAIR Left 03/18/2016   Procedure: SHOULDER ARTHROSCOPY WITH OPEN ROTATOR CUFF REPAIR;  Surgeon: Deeann Saint, MD;  Location: [facility];  Service: Orthopedics;  Laterality: Left;  . TONSILLECTOMY      Short Social History:  Social History   Tobacco Use  . Smoking  status: Never Smoker  . Smokeless tobacco: Never Used  Substance Use Topics  . Alcohol use: No    Allergies  Allergen Reactions  . Hydrocodone Itching and Nausea And Vomiting  . Sulfa Antibiotics Other (See Comments)    headaches  . Penicillins Rash    Did it involve swelling of the face/tongue/throat, SOB, or low BP? No Did it involve sudden or severe rash/hives, skin peeling, or any reaction on the inside of your mouth or nose? Yes Did you need to seek medical attention at a hospital or doctor's office? Yes When did it last happen?~8 years ago If all above answers are "NO", may proceed with cephalosporin use.     Current Outpatient Medications  Medication Sig Dispense Refill  . acetaminophen (TYLENOL) 325 MG tablet Take 325 mg by mouth every 6 (six) hours as needed (for pain.).    Marland Kitchen alendronate (FOSAMAX) 70 MG tablet Take 1 tablet (70 mg total) by mouth once a week. Take with a full glass of water on an empty stomach. 30 tablet 11  . amLODipine (NORVASC) 5 MG tablet Take 5 mg by mouth daily. AM    . eszopiclone (LUNESTA) 2 MG TABS tablet Take 2 mg by mouth at bedtime as needed.    . metoCLOPramide (REGLAN) 10 MG tablet Take 1 tablet (10 mg total) by mouth 3 (three) times daily before meals. 30 tablet 0  .  PARoxetine (PAXIL) 20 MG tablet Take 1 tablet (20 mg total) by mouth daily. 30 tablet 11  . sertraline (ZOLOFT) 50 MG tablet Take 50 mg by mouth daily. (Patient not taking: Reported on 01/24/2021)     No current facility-administered medications for this visit.    Review of Systems  Constitutional: Negative for chills, fatigue, fever and unexpected weight change.  HENT: Negative for trouble swallowing.  Eyes: Negative for loss of vision.  Respiratory: Negative for cough, shortness of breath and wheezing.  Cardiovascular: Negative for chest pain, leg swelling, palpitations and syncope.  GI: Negative for abdominal pain, blood in stool, diarrhea, nausea and vomiting.  GU:  Negative for difficulty urinating, dysuria, frequency and hematuria.  Musculoskeletal: Negative for back pain, leg pain and joint pain.  Skin: Negative for rash.  Neurological: Negative for dizziness, headaches, light-headedness, numbness and seizures.  Psychiatric: Negative for behavioral problem, confusion, depressed mood and sleep disturbance.        Objective:  Objective   Vitals:   01/24/21 1126  BP: 128/70  Weight: 196 lb 3.2 oz (89 kg)  Height: 5\' 4"  (1.626 m)   Body mass index is 33.68 kg/m.  Physical Exam Vitals and nursing note reviewed.  Constitutional:      Appearance: She is well-developed and well-nourished.  HENT:     Head: Normocephalic and atraumatic.  Eyes:     Extraocular Movements: EOM normal.     Pupils: Pupils are equal, round, and reactive to light.  Cardiovascular:     Rate and Rhythm: Normal rate and regular rhythm.  Pulmonary:     Effort: Pulmonary effort is normal. No respiratory distress.  Skin:    General: Skin is warm and dry.  Neurological:     Mental Status: She is alert and oriented to person, place, and time.  Psychiatric:        Mood and Affect: Mood and affect normal.        Behavior: Behavior normal.        Thought Content: Thought content normal.        Judgment: Judgment normal.     Assessment/Plan:     75 yo following up for depression and anxiety Improved with Paxil- will continue Patient taking fosamax.  Discussed considering pneumonia vaccination.  Condolences for her loss offered.  Will follow up in 6 months for annual.   More than 20 minutes were spent face to face with the patient in the room, reviewing the medical record, labs and images, and coordinating care for the patient. The plan of management was discussed in detail and counseling was provided.     61 MD [facility], [facility] 01/24/2021 12:02 PM

## 2021-04-17 ENCOUNTER — Telehealth (INDEPENDENT_AMBULATORY_CARE_PROVIDER_SITE_OTHER): Payer: Self-pay

## 2021-04-21 NOTE — Telephone Encounter (Signed)
For documentation

## 2021-04-23 NOTE — Progress Notes (Signed)
Patient ID: Brianna [name], female   DOB: [birth year], 75 y.o.   MRN: [id number]  Reason for Consult: Gynecologic Exam   Referred by Dortha Kern, MD  Subjective:     HPI:  Brianna [name] is a 76 y.o. female. She is here for follow up She has been doing well.   Gynecological History  No LMP recorded. Patient is postmenopausal.   History of fibroids, polyps, or ovarian cysts? : no  History of PCOS? no Hstory of Endometriosis? no History of abnormal pap smears? no Have you had any sexually transmitted infections in the past? no   Past Medical History:  Diagnosis Date  . Hypertension    Family History  Problem Relation Age of Onset  . Colon cancer Sister   . Breast cancer Neg Hx    Past Surgical History:  Procedure Laterality Date  . CATARACT EXTRACTION, BILATERAL Bilateral   . CERVICAL POLYPECTOMY  02/06/2020   Procedure:   POLYPECTOMY;  Surgeon: Natale Milch, MD;  Location: [facility];  Service: Gynecology;;  . CHOLECYSTECTOMY    . COLONOSCOPY WITH PROPOFOL N/A 10/17/2015   Procedure: COLONOSCOPY WITH PROPOFOL;  Surgeon: Midge Minium, MD;  Location: [facility];  Service: Endoscopy;  Laterality: N/A;  . HYSTEROSCOPY WITH D & C N/A 02/06/2020   Procedure: DILATATION AND CURETTAGE /HYSTEROSCOPY;  Surgeon: Natale Milch, MD;  Location: [facility];  Service: Gynecology;  Laterality: N/A;  . POLYPECTOMY  10/17/2015   Procedure: POLYPECTOMY;  Surgeon: Midge Minium, MD;  Location: [facility];  Service: Endoscopy;;  cecum polyp  . ROTATOR CUFF REPAIR Right   . SHOULDER ARTHROSCOPY WITH OPEN ROTATOR CUFF REPAIR Left 03/18/2016   Procedure: SHOULDER ARTHROSCOPY WITH OPEN ROTATOR CUFF REPAIR;  Surgeon: Deeann Saint, MD;  Location: [facility];  Service: Orthopedics;  Laterality: Left;  . TONSILLECTOMY      Short Social History:  Social History   Tobacco Use  . Smoking status: Never Smoker  . Smokeless tobacco: Never Used  Substance Use  Topics  . Alcohol use: No    Allergies  Allergen Reactions  . Hydrocodone Itching and Nausea And Vomiting  . Sulfa Antibiotics Other (See Comments)    headaches  . Penicillins Rash    Did it involve swelling of the face/tongue/throat, SOB, or low BP? No Did it involve sudden or severe rash/hives, skin peeling, or any reaction on the inside of your mouth or nose? Yes Did you need to seek medical attention at a hospital or doctor's office? Yes When did it last happen?~8 years ago If all above answers are "NO", may proceed with cephalosporin use.     Current Outpatient Medications  Medication Sig Dispense Refill  . acetaminophen (TYLENOL) 325 MG tablet Take 325 mg by mouth every 6 (six) hours as needed (for pain.).    Marland Kitchen alendronate (FOSAMAX) 70 MG tablet Take 1 tablet (70 mg total) by mouth once a week. Take with a full glass of water on an empty stomach. 30 tablet 11  . amLODipine (NORVASC) 5 MG tablet Take 5 mg by mouth daily. AM    . eszopiclone (LUNESTA) 2 MG TABS tablet Take 2 mg by mouth at bedtime as needed.    . metoCLOPramide (REGLAN) 10 MG tablet Take 1 tablet (10 mg total) by mouth 3 (three) times daily before meals. 30 tablet 0  . PARoxetine (PAXIL) 20 MG tablet Take 1 tablet (20 mg total) by mouth daily. 30 tablet 11  .  sertraline (ZOLOFT) 50 MG tablet Take 50 mg by mouth daily. (Patient not taking: Reported on 01/24/2021)     No current facility-administered medications for this visit.    REVIEW OF SYSTEMS      Objective:  Objective   Vitals:   10/09/20 1612  BP: 120/74  Weight: 198 lb 3.2 oz (89.9 kg)  Height: 5\' 4"  (1.626 m)   Body mass index is 34.02 kg/m.  Physical Exam  Assessment/Plan:     75 yo No new concerns addressed today. Minimal visit to check in with patient.    61 MD [facility], [facility] 04/23/2021 6:54 PM

## 2021-05-12 ENCOUNTER — Other Ambulatory Visit: Payer: Self-pay

## 2021-05-12 ENCOUNTER — Encounter (INDEPENDENT_AMBULATORY_CARE_PROVIDER_SITE_OTHER): Payer: Self-pay | Admitting: Nurse Practitioner

## 2021-05-12 ENCOUNTER — Ambulatory Visit (INDEPENDENT_AMBULATORY_CARE_PROVIDER_SITE_OTHER): Payer: Medicare Other | Admitting: Nurse Practitioner

## 2021-05-12 VITALS — BP 161/91 | HR 69 | Resp 16 | Ht 64.0 in | Wt 192.4 lb

## 2021-05-12 DIAGNOSIS — I8311 Varicose veins of right lower extremity with inflammation: Secondary | ICD-10-CM | POA: Diagnosis not present

## 2021-05-12 DIAGNOSIS — M5030 Other cervical disc degeneration, unspecified cervical region: Secondary | ICD-10-CM | POA: Diagnosis not present

## 2021-05-12 DIAGNOSIS — I8312 Varicose veins of left lower extremity with inflammation: Secondary | ICD-10-CM

## 2021-05-19 ENCOUNTER — Encounter (INDEPENDENT_AMBULATORY_CARE_PROVIDER_SITE_OTHER): Payer: Self-pay | Admitting: Nurse Practitioner

## 2021-05-19 NOTE — Progress Notes (Signed)
Subjective:    Patient ID: Brianna [name], female    DOB: July 09, 1946, 75 y.o.   MRN: [id number] Chief Complaint  Patient presents with  . New Patient (Initial Visit)    Ref Quillian Quince worsening varicose veins veins left    Brianna [name] is a 75 year old female that is referred to [place] today by Dr. Quillian Quince for evaluation of symptomatic varicose veins. The patient relates burning and stinging which worsened steadily throughout the course of the day, particularly with standing. The patient also notes an aching and throbbing pain over the varicosities, particularly with prolonged dependent positions. The symptoms are significantly improved with elevation.  The patient also notes that during hot weather the symptoms are greatly intensified. The patient states the pain from the varicose veins interferes with work, daily exercise, shopping and household maintenance. At this point, the symptoms are persistent and severe enough that they're having a negative impact on lifestyle and are interfering with daily activities.  There is no history of DVT, PE or superficial thrombophlebitis. There is no history of ulceration or hemorrhage. The patient denies a significant family history of varicose veins.  Currently the patient is wearing medical grade 1 compression stockings and elevating her lower extremity.  The patient has previously had sclerotherapy about 5 years ago in the left lower extremity.  She is also continue to utilize over-the-counter analgesics.     Review of Systems  Cardiovascular: Positive for leg swelling.  All other systems reviewed and are negative.      Objective:   Physical Exam Vitals reviewed.  HENT:     Head: Normocephalic.  Cardiovascular:     Rate and Rhythm: Normal rate.     Pulses: Normal pulses.  Pulmonary:     Effort: Pulmonary effort is normal.  Skin:    General: Skin is warm and dry.  Neurological:     Mental Status: She is alert and oriented to person,  place, and time.  Psychiatric:        Mood and Affect: Mood normal.        Behavior: Behavior normal.        Thought Content: Thought content normal.        Judgment: Judgment normal.     BP (!) 161/91 (BP Location: Left Arm)   Pulse 69   Resp 16   Ht 5\' 4"  (1.626 m)   Wt 192 lb 6.4 oz (87.3 kg)   BMI 33.03 kg/m   Past Medical History:  Diagnosis Date  . Hypertension     Social History   Socioeconomic History  . Marital status: Widowed    Spouse name: Not on file  . Number of children: Not on file  . Years of education: Not on file  . Highest education level: Not on file  Occupational History  . Not on file  Tobacco Use  . Smoking status: Never Smoker  . Smokeless tobacco: Never Used  Vaping Use  . Vaping Use: Never used  Substance and Sexual Activity  . Alcohol use: No  . Drug use: No  . Sexual activity: Not Currently    Birth control/protection: Post-menopausal  Other Topics Concern  . Not on file  Social History Narrative  . Not on file   Social Determinants of Health   Financial Resource Strain: Not on file  Food Insecurity: Not on file  Transportation Needs: Not on file  Physical Activity: Not on file  Stress: Not on file  Social Connections: Not on file  Intimate Partner Violence: Not on file    Past Surgical History:  Procedure Laterality Date  . CATARACT EXTRACTION, BILATERAL Bilateral   . CERVICAL POLYPECTOMY  02/06/2020   Procedure:   POLYPECTOMY;  Surgeon: Natale Milch, MD;  Location: [facility];  Service: Gynecology;;  . CHOLECYSTECTOMY    . COLONOSCOPY WITH PROPOFOL N/A 10/17/2015   Procedure: COLONOSCOPY WITH PROPOFOL;  Surgeon: Midge Minium, MD;  Location: [facility];  Service: Endoscopy;  Laterality: N/A;  . HYSTEROSCOPY WITH D & C N/A 02/06/2020   Procedure: DILATATION AND CURETTAGE /HYSTEROSCOPY;  Surgeon: Natale Milch, MD;  Location: [facility];  Service: Gynecology;  Laterality: N/A;  . POLYPECTOMY  10/17/2015    Procedure: POLYPECTOMY;  Surgeon: Midge Minium, MD;  Location: [facility];  Service: Endoscopy;;  cecum polyp  . ROTATOR CUFF REPAIR Right   . SHOULDER ARTHROSCOPY WITH OPEN ROTATOR CUFF REPAIR Left 03/18/2016   Procedure: SHOULDER ARTHROSCOPY WITH OPEN ROTATOR CUFF REPAIR;  Surgeon: Deeann Saint, MD;  Location: [facility];  Service: Orthopedics;  Laterality: Left;  . TONSILLECTOMY      Family History  Problem Relation Age of Onset  . Colon cancer Sister   . Breast cancer Neg Hx     Allergies  Allergen Reactions  . Hydrocodone Itching and Nausea And Vomiting  . Sulfa Antibiotics Other (See Comments)    headaches  . Penicillins Rash    Did it involve swelling of the face/tongue/throat, SOB, or low BP? No Did it involve sudden or severe rash/hives, skin peeling, or any reaction on the inside of your mouth or nose? Yes Did you need to seek medical attention at a hospital or doctor's office? Yes When did it last happen?~8 years ago If all above answers are "NO", may proceed with cephalosporin use.     CBC Latest Ref Rng & Units 06/20/2020 01/30/2020 03/09/2016  WBC 3.4 - 10.8 x10E3/uL 8.0 7.4 7.1  Hemoglobin 11.1 - 15.9 g/dL 82.9 56.2 13.0  Hematocrit 34.0 - 46.6 % 42.2 43.1 41.8  Platelets 150 - 450 x10E3/uL 216 203 207      CMP     Component Value Date/Time   NA 141 06/20/2020 1526   K 4.5 06/20/2020 1526   CL 101 06/20/2020 1526   CO2 27 06/20/2020 1526   GLUCOSE 74 06/20/2020 1526   BUN 10 06/20/2020 1526   CREATININE 0.87 06/20/2020 1526   CALCIUM 10.6 (H) 06/20/2020 1526   PROT 7.1 06/20/2020 1526   ALBUMIN 4.5 06/20/2020 1526   AST 14 06/20/2020 1526   ALT 16 06/20/2020 1526   ALKPHOS 92 06/20/2020 1526   BILITOT 0.5 06/20/2020 1526   GFRNONAA 66 06/20/2020 1526   GFRAA 76 06/20/2020 1526     No results found.     Assessment & Plan:   1. Varicose veins of both lower extremities with inflammation  Recommend:  The patient has large symptomatic  varicose veins that are painful and associated with swelling.  I have had a long discussion with the patient regarding  varicose veins and why they cause symptoms.  Patient will continue wearing graduated compression stockings class 1 on a daily basis, beginning first thing in the morning and removing them in the evening. The patient is instructed specifically not to sleep in the stockings.    The patient  will also continue using over-the-counter analgesics such as Motrin 600 mg po TID to help control the symptoms.    In addition, behavioral modification including  elevation during the day will be continued.    An  ultrasound of the venous system will be obtained.   Further plans will be based on the ultrasound results and whether conservative therapies are successful at eliminating the pain and swelling.   2. DDD (degenerative disc disease), cervical Continue NSAID medications as already ordered, these medications have been reviewed and there are no changes at this time.  Continued activity and therapy was stressed.    Current Outpatient Medications on File Prior to Visit  Medication Sig Dispense Refill  . acetaminophen (TYLENOL) 325 MG tablet Take 325 mg by mouth every 6 (six) hours as needed (for pain.).    Marland Kitchen alendronate (FOSAMAX) 70 MG tablet Take 1 tablet (70 mg total) by mouth once a week. Take with a full glass of water on an empty stomach. 30 tablet 11  . losartan (COZAAR) 50 MG tablet Take 50 mg by mouth daily.    . Multiple Vitamin (MULTIVITAMIN) tablet Take 1 tablet by mouth daily.    Marland Kitchen PARoxetine (PAXIL) 20 MG tablet Take 1 tablet (20 mg total) by mouth daily. 30 tablet 11  . amLODipine (NORVASC) 5 MG tablet Take 5 mg by mouth daily. AM (Patient not taking: Reported on 05/12/2021)    . eszopiclone (LUNESTA) 2 MG TABS tablet Take 2 mg by mouth at bedtime as needed. (Patient not taking: Reported on 05/12/2021)    . metoCLOPramide (REGLAN) 10 MG tablet Take 1 tablet (10 mg total)  by mouth 3 (three) times daily before meals. (Patient not taking: Reported on 05/12/2021) 30 tablet 0  . sertraline (ZOLOFT) 50 MG tablet Take 50 mg by mouth daily. (Patient not taking: No sig reported)     No current facility-administered medications on file prior to visit.    There are no Patient Instructions on file for this visit. No follow-ups on file.   Georgiana Spinner, NP

## 2021-05-20 ENCOUNTER — Ambulatory Visit (INDEPENDENT_AMBULATORY_CARE_PROVIDER_SITE_OTHER): Payer: Medicare Other | Admitting: Nurse Practitioner

## 2021-05-20 ENCOUNTER — Encounter (INDEPENDENT_AMBULATORY_CARE_PROVIDER_SITE_OTHER): Payer: Medicare Other

## 2021-05-27 ENCOUNTER — Telehealth: Payer: Self-pay

## 2021-05-27 NOTE — Telephone Encounter (Signed)
Pt calling; is having hot flashes really bad again; can rx be sent or does she need to be seen?  [phone]  Pt aware CRS on call; pharm correct.

## 2021-06-02 NOTE — Telephone Encounter (Signed)
Pt calling; is off HRT; now has hot flashes really bad; can [facility] rx something to help her.  Also, called 5/31 and no response.  [phone]

## 2021-06-05 ENCOUNTER — Other Ambulatory Visit (INDEPENDENT_AMBULATORY_CARE_PROVIDER_SITE_OTHER): Payer: Self-pay | Admitting: Nurse Practitioner

## 2021-06-05 DIAGNOSIS — I8312 Varicose veins of left lower extremity with inflammation: Secondary | ICD-10-CM

## 2021-06-09 ENCOUNTER — Ambulatory Visit (INDEPENDENT_AMBULATORY_CARE_PROVIDER_SITE_OTHER): Payer: Medicare Other

## 2021-06-09 ENCOUNTER — Ambulatory Visit (INDEPENDENT_AMBULATORY_CARE_PROVIDER_SITE_OTHER): Payer: Medicare Other | Admitting: Nurse Practitioner

## 2021-06-09 ENCOUNTER — Other Ambulatory Visit: Payer: Self-pay

## 2021-06-09 VITALS — BP 171/80 | HR 68 | Ht 64.0 in | Wt 195.0 lb

## 2021-06-09 DIAGNOSIS — I8311 Varicose veins of right lower extremity with inflammation: Secondary | ICD-10-CM

## 2021-06-09 DIAGNOSIS — M5030 Other cervical disc degeneration, unspecified cervical region: Secondary | ICD-10-CM

## 2021-06-09 DIAGNOSIS — I8312 Varicose veins of left lower extremity with inflammation: Secondary | ICD-10-CM

## 2021-06-16 ENCOUNTER — Encounter (INDEPENDENT_AMBULATORY_CARE_PROVIDER_SITE_OTHER): Payer: Self-pay | Admitting: Nurse Practitioner

## 2021-06-16 NOTE — Progress Notes (Signed)
Subjective:    Patient ID: Brianna [name], female    DOB: 1946/07/02, 75 y.o.   MRN: [id number] Chief Complaint  Patient presents with   Follow-up    Pt conv. BLE reflux    Brianna [name] is a 74 year old woman  the patient continues to have pain in the lower extremities with dependency. The pain is lessened with elevation. Graduated compression stockings, Class I (20-30 mmHg), have been worn but the stockings do not eliminate the leg pain.  These conservative therapies have been done for well over 3 months.  Over-the-counter analgesics do not improve the symptoms. The degree of discomfort continues to interfere with daily activities. The patient notes the pain in the legs is causing problems with daily exercise, at the workplace and even with household activities and maintenance such as standing in the kitchen preparing meals and doing dishes.   Venous ultrasound shows deep venous insufficiency, no evidence of acute or chronic DVT.  Superficial reflux is present in the left great saphenous vein beginning at the saphenofemoral junction extending to the knee   Review of Systems  Cardiovascular:  Positive for leg swelling.  All other systems reviewed and are negative.     Objective:   Physical Exam Vitals reviewed.  HENT:     Head: Normocephalic.  Cardiovascular:     Rate and Rhythm: Normal rate.     Pulses: Normal pulses.  Pulmonary:     Effort: Pulmonary effort is normal.  Musculoskeletal:     Left lower leg: Edema present.  Skin:    Comments: Large varicosities in left lower extremity  Neurological:     Mental Status: She is alert and oriented to person, place, and time.  Psychiatric:        Mood and Affect: Mood normal.        Behavior: Behavior normal.        Thought Content: Thought content normal.        Judgment: Judgment normal.    BP (!) 171/80   Pulse 68   Ht 5\' 4"  (1.626 m)   Wt 195 lb (88.5 kg)   BMI 33.47 kg/m   Past Medical History:  Diagnosis  Date   Hypertension     Social History   Socioeconomic History   Marital status: Widowed    Spouse name: Not on file   Number of children: Not on file   Years of education: Not on file   Highest education level: Not on file  Occupational History   Not on file  Tobacco Use   Smoking status: Never   Smokeless tobacco: Never  Vaping Use   Vaping Use: Never used  Substance and Sexual Activity   Alcohol use: No   Drug use: No   Sexual activity: Not Currently    Birth control/protection: Post-menopausal  Other Topics Concern   Not on file  Social History Narrative   Not on file   Social Determinants of Health   Financial Resource Strain: Not on file  Food Insecurity: Not on file  Transportation Needs: Not on file  Physical Activity: Not on file  Stress: Not on file  Social Connections: Not on file  Intimate Partner Violence: Not on file    Past Surgical History:  Procedure Laterality Date   CATARACT EXTRACTION, BILATERAL Bilateral    CERVICAL POLYPECTOMY  02/06/2020   Procedure:   POLYPECTOMY;  Surgeon: 04/05/2020, MD;  Location: [facility];  Service: Gynecology;;   CHOLECYSTECTOMY  COLONOSCOPY WITH PROPOFOL N/A 10/17/2015   Procedure: COLONOSCOPY WITH PROPOFOL;  Surgeon: Midge Minium, MD;  Location: [facility];  Service: Endoscopy;  Laterality: N/A;   HYSTEROSCOPY WITH D & C N/A 02/06/2020   Procedure: DILATATION AND CURETTAGE /HYSTEROSCOPY;  Surgeon: Natale Milch, MD;  Location: [facility];  Service: Gynecology;  Laterality: N/A;   POLYPECTOMY  10/17/2015   Procedure: POLYPECTOMY;  Surgeon: Midge Minium, MD;  Location: [facility];  Service: Endoscopy;;  cecum polyp   ROTATOR CUFF REPAIR Right    SHOULDER ARTHROSCOPY WITH OPEN ROTATOR CUFF REPAIR Left 03/18/2016   Procedure: SHOULDER ARTHROSCOPY WITH OPEN ROTATOR CUFF REPAIR;  Surgeon: Deeann Saint, MD;  Location: [facility];  Service: Orthopedics;  Laterality: Left;   TONSILLECTOMY       Family History  Problem Relation Age of Onset   Colon cancer Sister    Breast cancer Neg Hx     Allergies  Allergen Reactions   Hydrocodone Itching and Nausea And Vomiting   Sulfa Antibiotics Other (See Comments)    headaches   Penicillins Rash    Did it involve swelling of the face/tongue/throat, SOB, or low BP? No Did it involve sudden or severe rash/hives, skin peeling, or any reaction on the inside of your mouth or nose? Yes Did you need to seek medical attention at a hospital or doctor's office? Yes When did it last happen? ~8 years ago If all above answers are "NO", may proceed with cephalosporin use.     CBC Latest Ref Rng & Units 06/20/2020 01/30/2020 03/09/2016  WBC 3.4 - 10.8 x10E3/uL 8.0 7.4 7.1  Hemoglobin 11.1 - 15.9 g/dL 14.9 70.2 63.7  Hematocrit 34.0 - 46.6 % 42.2 43.1 41.8  Platelets 150 - 450 x10E3/uL 216 203 207      CMP     Component Value Date/Time   NA 141 06/20/2020 1526   K 4.5 06/20/2020 1526   CL 101 06/20/2020 1526   CO2 27 06/20/2020 1526   GLUCOSE 74 06/20/2020 1526   BUN 10 06/20/2020 1526   CREATININE 0.87 06/20/2020 1526   CALCIUM 10.6 (H) 06/20/2020 1526   PROT 7.1 06/20/2020 1526   ALBUMIN 4.5 06/20/2020 1526   AST 14 06/20/2020 1526   ALT 16 06/20/2020 1526   ALKPHOS 92 06/20/2020 1526   BILITOT 0.5 06/20/2020 1526   GFRNONAA 66 06/20/2020 1526   GFRAA 76 06/20/2020 1526     No results found.     Assessment & Plan:   1. 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 Patient will continue  wearing graduated compression stockings class 1 on a daily basis, beginning first thing in the morning and removing them in the evening.    In addition, behavioral modification including elevation during the day was again discussed and this will continue.  The patient has utilized over the counter  pain medications and has been exercising.  However, at this time conservative therapy has not alleviated the patient's symptoms of leg pain and swelling  Recommend: laser ablation of the  left great saphenous veins to eliminate the symptoms of pain and swelling of the lower extremities caused by the severe superficial venous reflux disease.    The patient wishes to discuss wound for with endovenous ablation with her husband.  She will contact our office if she wishes to move forward. 2. DDD (  degenerative disc disease), cervical Continue NSAID medications as already ordered, these medications have been reviewed and there are no changes at this time.  Continued activity and therapy was stressed.    Current Outpatient Medications on File Prior to Visit  Medication Sig Dispense Refill   acetaminophen (TYLENOL) 325 MG tablet Take 325 mg by mouth every 6 (six) hours as needed (for pain.).     alendronate (FOSAMAX) 70 MG tablet Take 1 tablet (70 mg total) by mouth once a week. Take with a full glass of water on an empty stomach. 30 tablet 11   amLODipine (NORVASC) 5 MG tablet Take 5 mg by mouth daily. AM     docusate sodium (COLACE) 100 MG capsule Take 100-200 mg by mouth daily as needed.     eszopiclone (LUNESTA) 2 MG TABS tablet Take 2 mg by mouth at bedtime as needed.     losartan (COZAAR) 50 MG tablet Take 50 mg by mouth daily.     metoCLOPramide (REGLAN) 10 MG tablet Take 1 tablet (10 mg total) by mouth 3 (three) times daily before meals. 30 tablet 0   Multiple Vitamin (MULTIVITAMIN) tablet Take 1 tablet by mouth daily.     PARoxetine (PAXIL) 20 MG tablet Take 1 tablet (20 mg total) by mouth daily. 30 tablet 11   sertraline (ZOLOFT) 50 MG tablet Take 50 mg by mouth daily.     No current facility-administered medications on file prior to visit.    There are no Patient Instructions on file for this visit. No follow-ups on file.   Georgiana Spinner, NP

## 2021-07-15 NOTE — Telephone Encounter (Signed)
Pt has appointment c [facility] on 07/29/21.

## 2021-07-29 ENCOUNTER — Ambulatory Visit: Payer: Medicare Other | Admitting: Obstetrics and Gynecology

## 2021-08-03 ENCOUNTER — Other Ambulatory Visit: Payer: Self-pay | Admitting: Obstetrics and Gynecology

## 2021-08-03 DIAGNOSIS — F320 Major depressive disorder, single episode, mild: Secondary | ICD-10-CM

## 2021-08-03 DIAGNOSIS — F32A Depression, unspecified: Secondary | ICD-10-CM

## 2021-09-03 ENCOUNTER — Ambulatory Visit: Payer: Medicare Other | Admitting: Obstetrics and Gynecology

## 2021-09-10 ENCOUNTER — Emergency Department: Payer: Medicare Other

## 2021-09-10 ENCOUNTER — Emergency Department
Admission: EM | Admit: 2021-09-10 | Discharge: 2021-09-10 | Disposition: A | Discharge status: Other Acute Inpatient Hospital | Payer: Medicare Other | Attending: Emergency Medicine | Admitting: Emergency Medicine

## 2021-09-10 DIAGNOSIS — Y92009 Unspecified place in unspecified non-institutional (private) residence as the place of occurrence of the external cause: Secondary | ICD-10-CM

## 2021-09-10 DIAGNOSIS — I10 Essential (primary) hypertension: Secondary | ICD-10-CM | POA: Insufficient documentation

## 2021-09-10 DIAGNOSIS — Z79899 Other long term (current) drug therapy: Secondary | ICD-10-CM | POA: Insufficient documentation

## 2021-09-10 DIAGNOSIS — W19XXXA Unspecified fall, initial encounter: Secondary | ICD-10-CM

## 2021-09-10 DIAGNOSIS — Y9384 Activity, sleeping: Secondary | ICD-10-CM | POA: Insufficient documentation

## 2021-09-10 DIAGNOSIS — S0990XA Unspecified injury of head, initial encounter: Secondary | ICD-10-CM | POA: Diagnosis not present

## 2021-09-10 DIAGNOSIS — S0532XA Ocular laceration without prolapse or loss of intraocular tissue, left eye, initial encounter: Secondary | ICD-10-CM | POA: Diagnosis not present

## 2021-09-10 DIAGNOSIS — W06XXXA Fall from bed, initial encounter: Secondary | ICD-10-CM | POA: Insufficient documentation

## 2021-09-10 DIAGNOSIS — S0592XA Unspecified injury of left eye and orbit, initial encounter: Secondary | ICD-10-CM | POA: Diagnosis present

## 2021-09-10 DIAGNOSIS — S40012A Contusion of left shoulder, initial encounter: Secondary | ICD-10-CM | POA: Insufficient documentation

## 2021-09-10 MED ORDER — DIPHENHYDRAMINE HCL 50 MG/ML IJ SOLN
25.0000 mg | Freq: Once | INTRAMUSCULAR | Status: AC
  Administered 2021-09-10: 25 mg via INTRAVENOUS

## 2021-09-10 MED ORDER — FLUORESCEIN SODIUM 1 MG OP STRP
1.0000 | ORAL_STRIP | Freq: Once | OPHTHALMIC | Status: AC
  Administered 2021-09-10: 1 via OPHTHALMIC
  Filled 2021-09-10: qty 1

## 2021-09-10 MED ORDER — VANCOMYCIN HCL 2000 MG/400ML IV SOLN
2000.0000 mg | Freq: Once | INTRAVENOUS | Status: AC
  Administered 2021-09-10: 2000 mg via INTRAVENOUS
  Filled 2021-09-10: qty 400

## 2021-09-10 MED ORDER — TETRACAINE HCL 0.5 % OP SOLN
1.0000 [drp] | Freq: Once | OPHTHALMIC | Status: AC
  Administered 2021-09-10: 1 [drp] via OPHTHALMIC
  Filled 2021-09-10: qty 4

## 2021-09-10 MED ORDER — PROPARACAINE HCL 0.5 % OP SOLN
1.0000 [drp] | Freq: Once | OPHTHALMIC | Status: DC
  Filled 2021-09-10: qty 15

## 2021-09-10 MED ORDER — LEVOFLOXACIN IN D5W 750 MG/150ML IV SOLN
750.0000 mg | Freq: Once | INTRAVENOUS | Status: AC
  Administered 2021-09-10: 750 mg via INTRAVENOUS
  Filled 2021-09-10: qty 150

## 2021-09-10 MED ORDER — DIPHENHYDRAMINE HCL 50 MG/ML IJ SOLN
INTRAMUSCULAR | Status: AC
  Filled 2021-09-10: qty 1

## 2021-09-10 NOTE — ED Notes (Signed)
Eye shield applied by this RN to this pt's L eye at this time.

## 2021-09-10 NOTE — ED Provider Notes (Signed)
[facility] Emergency Department Provider Note   ____________________________________________   Event Date/Time   First MD Initiated Contact with Patient 09/10/21 [phone]     (approximate)  I have reviewed the triage vital signs and the nursing notes.   HISTORY  Chief Complaint Fall (Fall from bed)    HPI Brianna [name] is a 75 y.o. female who presents via EMS after a fall out of bed hitting her left eye on the corner of a dresser.  Patient describes aching and swelling to the left eyeball as well as decreased visual acuity with only the ability to identify colors in this left eye.  Patient also states that it looks like she is "seeing through her eyelashes".  Patient denies any exacerbating or relieving factors for the symptoms.  Patient denies any other signs of trauma other than a small amount of bruising to her left clavicle.  Patient denies any loss of consciousness and states that this fall woke her up out of sleep.         Past Medical History:  Diagnosis Date   Hypertension     Patient Active Problem List   Diagnosis Date Noted   DDD (degenerative disc disease), cervical 10/09/2020   Cervical radiculitis 11/22/2017   Full thickness rotator cuff tear 01/16/2016   Impingement syndrome of shoulder region 12/06/2015   History of colonic polyps     Past Surgical History:  Procedure Laterality Date   CATARACT EXTRACTION, BILATERAL Bilateral    CERVICAL POLYPECTOMY  02/06/2020   Procedure:   POLYPECTOMY;  Surgeon: Natale Milch, MD;  Location: [facility] ORS;  Service: Gynecology;;   CHOLECYSTECTOMY     COLONOSCOPY WITH PROPOFOL N/A 10/17/2015   Procedure: COLONOSCOPY WITH PROPOFOL;  Surgeon: Midge Minium, MD;  Location: [facility];  Service: Endoscopy;  Laterality: N/A;   HYSTEROSCOPY WITH D & C N/A 02/06/2020   Procedure: DILATATION AND CURETTAGE /HYSTEROSCOPY;  Surgeon: Natale Milch, MD;  Location: [facility];  Service:  Gynecology;  Laterality: N/A;   POLYPECTOMY  10/17/2015   Procedure: POLYPECTOMY;  Surgeon: Midge Minium, MD;  Location: [facility];  Service: Endoscopy;;  cecum polyp   ROTATOR CUFF REPAIR Right    SHOULDER ARTHROSCOPY WITH OPEN ROTATOR CUFF REPAIR Left 03/18/2016   Procedure: SHOULDER ARTHROSCOPY WITH OPEN ROTATOR CUFF REPAIR;  Surgeon: Deeann Saint, MD;  Location: [facility];  Service: Orthopedics;  Laterality: Left;   TONSILLECTOMY      Prior to Admission medications   Medication Sig Start Date End Date Taking? Authorizing Provider  acetaminophen (TYLENOL) 325 MG tablet Take 325 mg by mouth every 6 (six) hours as needed (for pain.).    [provider]  alendronate (FOSAMAX) 70 MG tablet Take 1 tablet (70 mg total) by mouth once a week. Take with a full glass of water on an empty stomach. 07/09/20   Schuman, Christanna R, MD  amLODipine (NORVASC) 5 MG tablet Take 5 mg by mouth daily. AM    [provider]  docusate sodium (COLACE) 100 MG capsule Take 100-200 mg by mouth daily as needed. 05/13/21   [provider]  eszopiclone (LUNESTA) 2 MG TABS tablet Take 2 mg by mouth at bedtime as needed. 12/25/19   [provider]  losartan (COZAAR) 50 MG tablet Take 50 mg by mouth daily.    [provider]  metoCLOPramide (REGLAN) 10 MG tablet Take 1 tablet (10 mg total) by mouth 3 (three) times daily before meals.  02/14/20   Natale Milch, MD  Multiple Vitamin (MULTIVITAMIN) tablet Take 1 tablet by mouth daily.    [provider]  PARoxetine (PAXIL) 20 MG tablet TAKE 1 TABLET(20 MG) BY MOUTH DAILY 08/21/21   Schuman, Christanna R, MD  sertraline (ZOLOFT) 50 MG tablet Take 50 mg by mouth daily. 12/30/19   [provider]    Allergies Hydrocodone, Sulfa antibiotics, and Penicillins  Family History  Problem Relation Age of Onset   Colon cancer Sister    Breast cancer Neg Hx     Social History Social History   Tobacco Use    Smoking status: Never   Smokeless tobacco: Never  Vaping Use   Vaping Use: Never used  Substance Use Topics   Alcohol use: No   Drug use: No    Review of Systems Constitutional: No fever/chills Eyes: Endorses left eye pain, swelling, and decreased visual acuity. ENT: No sore throat. Cardiovascular: Denies chest pain. Respiratory: Denies shortness of breath. Gastrointestinal: No abdominal pain.  No nausea, no vomiting.  No diarrhea. Genitourinary: Negative for dysuria. Musculoskeletal: Negative for acute arthralgias Skin: Negative for rash. Neurological: Negative for headaches, weakness/numbness/paresthesias in any extremity Psychiatric: Negative for suicidal ideation/homicidal ideation   ____________________________________________   PHYSICAL EXAM:  VITAL SIGNS: ED Triage Vitals  Enc Vitals Group     BP 09/10/21 0831 (!) 183/76     Pulse Rate 09/10/21 0831 77     Resp 09/10/21 0831 18     Temp 09/10/21 0831 98.1 F (36.7 C)     Temp Source 09/10/21 0831 Oral     SpO2 09/10/21 0831 97 %     Weight 09/10/21 0831 182 lb (82.6 kg)     Height 09/10/21 0831 5\' 4"  (1.626 m)     Head Circumference --      Peak Flow --      Pain Score 09/10/21 0838 4     Pain Loc --      Pain Edu? --      Excl. in GC? --    Constitutional: Alert and oriented. Well appearing and in no acute distress. Eyes: Right eye conjunctive a is normal and pupil is 4 mm, round, and reactive to light.  Left eye with generalized chemosis, decreased visual acuity to identifying color only, no Seidel sign on fluorescein exam Head: Small amount of ecchymosis to the lateral left periorbital region Nose: No congestion/rhinnorhea. Mouth/Throat: Mucous membranes are moist. Neck: No stridor Cardiovascular: Grossly normal heart sounds.  Good peripheral circulation. Respiratory: Normal respiratory effort.  No retractions. Gastrointestinal: Soft and nontender. No distention. Musculoskeletal: No obvious  deformities Neurologic:  Normal speech and language. No gross focal neurologic deficits are appreciated. Skin:  Skin is warm and dry.  Small amount of abrasion and ecchymosis over left clavicle Psychiatric: Mood and affect are normal. Speech and behavior are normal.  ____________________________________________   LABS (all labs ordered are listed, but only abnormal results are displayed)  Labs Reviewed - No data to display RADIOLOGY  ED MD interpretation: CT of the maxillofacial structures show left globe deformed and smaller in size in the contralateral side suspicious for likely globe injury  CT of the head without contrast shows no evidence of acute abnormalities including no intracerebral hemorrhage, obvious masses, or significant edema  CT of the cervical spine shows no evidence of fractures or dislocations  Official radiology report(s): CT Head Wo Contrast  Result Date: 09/10/2021 CLINICAL DATA:  Fall out of bed with left orbital  injury. EXAM: CT HEAD WITHOUT CONTRAST CT MAXILLOFACIAL WITHOUT CONTRAST CT CERVICAL SPINE WITHOUT CONTRAST TECHNIQUE: Multidetector CT imaging of the head, cervical spine, and maxillofacial structures were performed using the standard protocol without intravenous contrast. Multiplanar CT image reconstructions of the cervical spine and maxillofacial structures were also generated. COMPARISON:  None. FINDINGS: CT HEAD FINDINGS Brain: There is no evidence for acute hemorrhage, hydrocephalus, mass lesion, or abnormal extra-axial fluid collection. No definite CT evidence for acute infarction. Diffuse loss of parenchymal volume is consistent with atrophy. Patchy low attenuation in the deep hemispheric and periventricular white matter is nonspecific, but likely reflects chronic microvascular ischemic demyelination. Vascular: No hyperdense vessel or unexpected calcification. Skull: No evidence for fracture. No worrisome lytic or sclerotic lesion. Other: None. CT  MAXILLOFACIAL FINDINGS Osseous: No fracture or mandibular dislocation. No destructive process. Orbits: Left globe is deformed with irregular contour and smaller size compared to the contralateral side. There is some edema/fluid in the region of the eyelids. Intra orbital fat is preserved. No orbital wall fracture. Sinuses: The visualized paranasal sinuses and mastoid air cells are clear. Soft tissues: As above. CT CERVICAL SPINE FINDINGS Alignment: Straightening of normal cervical lordosis evident Skull base and vertebrae: No acute fracture. No primary bone lesion or focal pathologic process. Soft tissues and spinal canal: No prevertebral fluid or swelling. No visible canal hematoma. Disc levels: Loss of disc height noted C4-5 and C6-7. Diffuse facet osteoarthritis noted bilaterally. Upper chest: Unremarkable. Other: None. IMPRESSION: 1. Left globe is deformed and smaller in size than the contralateral side. CT imaging features highly suspicious for left sided globe injury. No associated orbital wall or maxillary sinus fracture. 2. No acute intracranial abnormality. Atrophy with chronic small vessel white matter ischemic disease. 3. Degenerative changes in the cervical spine without acute fracture. I personally called the results of this study to Dr. Vicente Males at approximately 0910 hours on 09/10/2021. Electronically Signed   By: Kennith Center M.D.   On: 09/10/2021 09:11   CT Cervical Spine Wo Contrast  Result Date: 09/10/2021 CLINICAL DATA:  Fall out of bed with left orbital injury. EXAM: CT HEAD WITHOUT CONTRAST CT MAXILLOFACIAL WITHOUT CONTRAST CT CERVICAL SPINE WITHOUT CONTRAST TECHNIQUE: Multidetector CT imaging of the head, cervical spine, and maxillofacial structures were performed using the standard protocol without intravenous contrast. Multiplanar CT image reconstructions of the cervical spine and maxillofacial structures were also generated. COMPARISON:  None. FINDINGS: CT HEAD FINDINGS Brain: There is  no evidence for acute hemorrhage, hydrocephalus, mass lesion, or abnormal extra-axial fluid collection. No definite CT evidence for acute infarction. Diffuse loss of parenchymal volume is consistent with atrophy. Patchy low attenuation in the deep hemispheric and periventricular white matter is nonspecific, but likely reflects chronic microvascular ischemic demyelination. Vascular: No hyperdense vessel or unexpected calcification. Skull: No evidence for fracture. No worrisome lytic or sclerotic lesion. Other: None. CT MAXILLOFACIAL FINDINGS Osseous: No fracture or mandibular dislocation. No destructive process. Orbits: Left globe is deformed with irregular contour and smaller size compared to the contralateral side. There is some edema/fluid in the region of the eyelids. Intra orbital fat is preserved. No orbital wall fracture. Sinuses: The visualized paranasal sinuses and mastoid air cells are clear. Soft tissues: As above. CT CERVICAL SPINE FINDINGS Alignment: Straightening of normal cervical lordosis evident Skull base and vertebrae: No acute fracture. No primary bone lesion or focal pathologic process. Soft tissues and spinal canal: No prevertebral fluid or swelling. No visible canal hematoma. Disc levels: Loss of disc height noted C4-5  and C6-7. Diffuse facet osteoarthritis noted bilaterally. Upper chest: Unremarkable. Other: None. IMPRESSION: 1. Left globe is deformed and smaller in size than the contralateral side. CT imaging features highly suspicious for left sided globe injury. No associated orbital wall or maxillary sinus fracture. 2. No acute intracranial abnormality. Atrophy with chronic small vessel white matter ischemic disease. 3. Degenerative changes in the cervical spine without acute fracture. I personally called the results of this study to Dr. Vicente Males at approximately 0910 hours on 09/10/2021. Electronically Signed   By: Kennith Center M.D.   On: 09/10/2021 09:11   CT Maxillofacial WO  CM  Result Date: 09/10/2021 CLINICAL DATA:  Fall out of bed with left orbital injury. EXAM: CT HEAD WITHOUT CONTRAST CT MAXILLOFACIAL WITHOUT CONTRAST CT CERVICAL SPINE WITHOUT CONTRAST TECHNIQUE: Multidetector CT imaging of the head, cervical spine, and maxillofacial structures were performed using the standard protocol without intravenous contrast. Multiplanar CT image reconstructions of the cervical spine and maxillofacial structures were also generated. COMPARISON:  None. FINDINGS: CT HEAD FINDINGS Brain: There is no evidence for acute hemorrhage, hydrocephalus, mass lesion, or abnormal extra-axial fluid collection. No definite CT evidence for acute infarction. Diffuse loss of parenchymal volume is consistent with atrophy. Patchy low attenuation in the deep hemispheric and periventricular white matter is nonspecific, but likely reflects chronic microvascular ischemic demyelination. Vascular: No hyperdense vessel or unexpected calcification. Skull: No evidence for fracture. No worrisome lytic or sclerotic lesion. Other: None. CT MAXILLOFACIAL FINDINGS Osseous: No fracture or mandibular dislocation. No destructive process. Orbits: Left globe is deformed with irregular contour and smaller size compared to the contralateral side. There is some edema/fluid in the region of the eyelids. Intra orbital fat is preserved. No orbital wall fracture. Sinuses: The visualized paranasal sinuses and mastoid air cells are clear. Soft tissues: As above. CT CERVICAL SPINE FINDINGS Alignment: Straightening of normal cervical lordosis evident Skull base and vertebrae: No acute fracture. No primary bone lesion or focal pathologic process. Soft tissues and spinal canal: No prevertebral fluid or swelling. No visible canal hematoma. Disc levels: Loss of disc height noted C4-5 and C6-7. Diffuse facet osteoarthritis noted bilaterally. Upper chest: Unremarkable. Other: None. IMPRESSION: 1. Left globe is deformed and smaller in size than  the contralateral side. CT imaging features highly suspicious for left sided globe injury. No associated orbital wall or maxillary sinus fracture. 2. No acute intracranial abnormality. Atrophy with chronic small vessel white matter ischemic disease. 3. Degenerative changes in the cervical spine without acute fracture. I personally called the results of this study to Dr. Vicente Males at approximately 0910 hours on 09/10/2021. Electronically Signed   By: Kennith Center M.D.   On: 09/10/2021 09:11    ____________________________________________   PROCEDURES  Procedure(s) performed (including Critical Care):  .1-3 Lead EKG Interpretation Performed by: Merwyn Katos, MD Authorized by: Merwyn Katos, MD     Interpretation: normal     ECG rate:  75   ECG rate assessment: normal     Rhythm: sinus rhythm     Ectopy: none     Conduction: normal    CRITICAL CARE Performed by: Merwyn Katos   Total critical care time: 33 minutes  Critical care time was exclusive of separately billable procedures and treating other patients.  Critical care was necessary to treat or prevent imminent or life-threatening deterioration.  Critical care was time spent personally by me on the following activities: development of treatment plan with patient and/or surrogate as well as nursing, discussions with  consultants, evaluation of patient's response to treatment, examination of patient, obtaining history from patient or surrogate, ordering and performing treatments and interventions, ordering and review of laboratory studies, ordering and review of radiographic studies, pulse oximetry and re-evaluation of patient's condition.  ____________________________________________   INITIAL IMPRESSION / ASSESSMENT AND PLAN / ED COURSE  As part of my medical decision making, I reviewed the following data within the electronic medical record, if available:  Nursing notes reviewed and incorporated, Labs reviewed, EKG  interpreted, Old chart reviewed, Radiograph reviewed and Notes from prior ED visits reviewed and incorporated      Patient is a 75 year old female who presents after an unwitnessed fall out of bed striking the left eye on the corner of a bedside table resulting in ocular trauma.  Given history, physical exam, and radiologic evaluation, I have low suspicion for acute spinal fracture/injury, intracranial hemorrhage, skull fracture CT of the max face does show evidence of likely left globe rupture.  I spoke to our on-call ophthalmologist who recommended transfer to a larger facility who will have the resources for ocular repair.  Spoke to Dr. Nyra Capes at [facility] who agrees to accept this patient in transfer from ER to ER.  Dispo: Transfer to [facility] ophthalmology     ____________________________________________   FINAL CLINICAL IMPRESSION(S) / ED DIAGNOSES  Final diagnoses:  Fall in home, initial encounter  Injury of head, initial encounter  Ruptured globe of left eye, initial encounter     ED Discharge Orders     None        Note:  This document was prepared using Dragon voice recognition software and may include unintentional dictation errors.    Merwyn Katos, MD 09/10/21 [phone]

## 2021-09-10 NOTE — ED Notes (Signed)
Called [facility] for transfer, images powershared waiting call back  [phone]

## 2021-09-10 NOTE — ED Triage Notes (Signed)
Pt reports she was sleeping and rolled over in her bed and fell off bed and hit L eye on corner of night stand around 0530. Pt states vision out of L eye "looks like all I can see is my eyelashes in my eye". Eye noticeably red and swollen. Sclera red and swollen.

## 2021-09-10 NOTE — ED Notes (Signed)
Called [facility] for transfer Brianna [name] stated no ophthalmologist available inpatient

## 2021-09-10 NOTE — Progress Notes (Signed)
PHARMACY -  BRIEF ANTIBIOTIC NOTE   Pharmacy has received consult(s) for vancomycin from an ED provider.  The patient's profile has been reviewed for ht/wt/allergies/indication/available labs.    One time order(s) placed for vancomycin 2,000 mg LD x 1  Further antibiotics/pharmacy consults should be ordered by admitting physician if indicated.                       Thank you, Jaynie Bream, PharmD Pharmacy Resident  09/10/2021 12:09 PM'

## 2021-09-10 NOTE — ED Notes (Signed)
[facility] accepted ED to ED, Lauren representative

## 2021-09-10 NOTE — ED Notes (Signed)
All documentation reviewed by this Consulting civil engineer. All documentation is complete.

## 2021-09-10 NOTE — ED Notes (Signed)
Pt called out and reported severe itching all over body. MD Vicente Males made aware, MD gave orders for diphenhydramine and rate of vanomycin to be decreased.

## 2021-09-29 ENCOUNTER — Other Ambulatory Visit: Payer: Self-pay | Admitting: Obstetrics and Gynecology

## 2021-09-29 DIAGNOSIS — M8580 Other specified disorders of bone density and structure, unspecified site: Secondary | ICD-10-CM

## 2021-09-29 DIAGNOSIS — Z9189 Other specified personal risk factors, not elsewhere classified: Secondary | ICD-10-CM

## 2021-10-13 ENCOUNTER — Ambulatory Visit (INDEPENDENT_AMBULATORY_CARE_PROVIDER_SITE_OTHER): Payer: Medicare Other | Admitting: Obstetrics and Gynecology

## 2021-10-13 ENCOUNTER — Other Ambulatory Visit: Payer: Self-pay

## 2021-10-13 ENCOUNTER — Encounter: Payer: Self-pay | Admitting: Obstetrics and Gynecology

## 2021-10-13 VITALS — BP 130/72 | Ht 64.0 in | Wt 194.0 lb

## 2021-10-13 DIAGNOSIS — Z01411 Encounter for gynecological examination (general) (routine) with abnormal findings: Secondary | ICD-10-CM | POA: Diagnosis not present

## 2021-10-13 DIAGNOSIS — Z01419 Encounter for gynecological examination (general) (routine) without abnormal findings: Secondary | ICD-10-CM

## 2021-10-13 DIAGNOSIS — Z1211 Encounter for screening for malignant neoplasm of colon: Secondary | ICD-10-CM

## 2021-10-13 DIAGNOSIS — K5900 Constipation, unspecified: Secondary | ICD-10-CM

## 2021-10-13 DIAGNOSIS — Z1231 Encounter for screening mammogram for malignant neoplasm of breast: Secondary | ICD-10-CM

## 2021-10-13 DIAGNOSIS — N852 Hypertrophy of uterus: Secondary | ICD-10-CM | POA: Diagnosis not present

## 2021-10-13 NOTE — Patient Instructions (Addendum)
[employer]stitute [employer] Recommended Dietary Allowances for Calcium and Vitamin D  Age (yr) Calcium Recommended Dietary Allowance (mg/day) Vitamin D Recommended Dietary Allowance (international units/day)  9-18 1,300 600  19-50 1,000 600  51-70 1,200 600  71 and older 1,200 800  Data from [employer]. Dietary reference intakes: calcium, vitamin D. [place], [place]: National Academies Press; 2011.   Exercising to Stay Healthy To become healthy and stay healthy, it is recommended that you do moderate-intensity and vigorous-intensity exercise. You can tell that you are exercising at a moderate intensity if your heart starts beating faster and you start breathing faster but can still hold a conversation. You can tell that you are exercising at a vigorous intensity if you are breathing much harder and faster and cannot hold a conversation while exercising. How can exercise benefit me? Exercising regularly is important. It has many health benefits, such as: Improving overall fitness, flexibility, and endurance. Increasing bone density. Helping with weight control. Decreasing body fat. Increasing muscle strength and endurance. Reducing stress and tension, anxiety, depression, or anger. Improving overall health. What guidelines should I follow while exercising? Before you start a new exercise program, talk with your health care provider. Do not exercise so much that you hurt yourself, feel dizzy, or get very short of breath. Wear comfortable clothes and wear shoes with good support. Drink plenty of water while you exercise to prevent dehydration or heat stroke. Work out until your breathing and your heartbeat get faster (moderate intensity). How often should I exercise? Choose an activity that you enjoy, and set realistic goals. Your health care provider can help you make an activity plan that is individually designed and works best for you. Exercise regularly as told by your health  care provider. This may include: Doing strength training two times a week, such as: Lifting weights. Using resistance bands. Push-ups. Sit-ups. Yoga. Doing a certain intensity of exercise for a given amount of time. Choose from these options: A total of 150 minutes of moderate-intensity exercise every week. A total of 75 minutes of vigorous-intensity exercise every week. A mix of moderate-intensity and vigorous-intensity exercise every week. Children, pregnant women, people who have not exercised regularly, people who are overweight, and older adults may need to talk with a health care provider about what activities are safe to perform. If you have a medical condition, be sure to talk with your health care provider before you start a new exercise program. What are some exercise ideas? Moderate-intensity exercise ideas include: Walking 1 mile (1.6 km) in about 15 minutes. Biking. Hiking. Golfing. Dancing. Water aerobics. Vigorous-intensity exercise ideas include: Walking 4.5 miles (7.2 km) or more in about 1 hour. Jogging or running 5 miles (8 km) in about 1 hour. Biking 10 miles (16.1 km) or more in about 1 hour. Lap swimming. Roller-skating or in-line skating. Cross-country skiing. Vigorous competitive sports, such as football, basketball, and soccer. Jumping rope. Aerobic dancing. What are some everyday activities that can help me get exercise? Yard work, such as: Pushing a lawn mower. Raking and bagging leaves. Washing your car. Pushing a stroller. Shoveling snow. Gardening. Washing windows or floors. How can I be more active in my day-to-day activities? Use stairs instead of an elevator. Take a walk during your lunch break. If you drive, park your car farther away from your work or school. If you take public transportation, get off one stop early and walk the rest of the way. Stand up or walk around during all of   your indoor phone calls. Get up, stretch, and walk  around every 30 minutes throughout the day. Enjoy exercise with a friend. Support to continue exercising will help you keep a regular routine of activity. Where to find more information You can find more information about exercising to stay healthy from: U.S. Department of Health and Human Services: www.hhs.gov Centers for Disease Control and Prevention (CDC): www.cdc.gov Summary Exercising regularly is important. It will improve your overall fitness, flexibility, and endurance. Regular exercise will also improve your overall health. It can help you control your weight, reduce stress, and improve your bone density. Do not exercise so much that you hurt yourself, feel dizzy, or get very short of breath. Before you start a new exercise program, talk with your health care provider. This information is not intended to replace advice given to you by your health care provider. Make sure you discuss any questions you have with your health care provider. Document Revised: 04/11/2021 Document Reviewed: 04/11/2021 Elsevier Patient Education  2022 Else[employer] Inc. Budget-Friendly Healthy Eating There are many ways to save money at the grocery store and continue to eat healthy. You can be successful if you: Plan meals according to your budget. Make a grocery list and only purchase food according to your grocery list. Prepare food yourself at home. What are tips for following this plan? Reading food labels Compare food labels between brand name foods and the store brand. Often the nutritional value is the same, but the store brand is lower cost. Look for products that do not have added sugar, fat, or salt (sodium). These often cost the same but are healthier for you. Products may be labeled as: Sugar-free. Nonfat. Low-fat. Sodium-free. Low-sodium. Look for lean ground beef labeled as at least 92% lean and 8% fat. Shopping  Buy only the items on your grocery list and go only to the areas of the store  that have the items on your list. Use coupons only for foods and brands you normally buy. Avoid buying items you wouldn't normally buy simply because they are on sale. Check online and in newspapers for weekly deals. Buy healthy items from the bulk bins when available, such as herbs, spices, flour, pasta, nuts, and dried fruit. Buy fruits and vegetables that are in season. Prices are usually lower on in-season produce. Look at the unit price on the price tag. Use it to compare different brands and sizes to find out which item is the best deal. Choose healthy items that are often low-cost, such as carrots, potatoes, apples, bananas, and oranges. Dried or canned beans are a low-cost protein source. Buy in bulk and freeze extra food. Items you can buy in bulk include meats, fish, poultry, frozen fruits, and frozen vegetables. Avoid buying "ready-to-eat" foods, such as pre-cut fruits and vegetables and pre-made salads. If possible, shop around to discover where you can find the best prices. Consider other retailers such as dollar stores, larger wholesale stores, local fruit and vegetable stands, and farmers markets. Do not shop when you are hungry. If you shop while hungry, it may be hard to stick to your list and budget. Resist impulse buying. Use your grocery list as your official plan for the week. Buy a variety of vegetables and fruits by purchasing fresh, frozen, and canned items. Look at the top and bottom shelves for deals. Foods at eye level (eye level of an adult or child) are usually more expensive. Be efficient with your time when shopping. The more time you   spend at the store, the more money you are likely to spend. To save money when choosing more expensive foods like meats and dairy: Choose cheaper cuts of meat, such as bone-in chicken thighs and drumsticks instead of skinless and boneless chicken. When you are ready to prepare the chicken, you can remove the skin yourself to make it  healthier. Choose lean meats like chicken or turkey instead of beef. Choose canned seafood, such as tuna, salmon, or sardines. Buy eggs as a low-cost source of protein. Buy dried beans and peas, such as lentils, split peas, or kidney beans instead of meats. Dried beans and peas are a good alternative source of protein. Buy the larger tubs of yogurt instead of individual-sized containers. Choose water instead of sodas and other sweetened beverages. Avoid buying chips, cookies, and other "junk food." These items are usually expensive and not healthy. Cooking Make extra food and freeze the extras in meal-sized containers or in individual portions for fast meals and snacks. Pre-cook on days when you have extra time to prepare meals in advance. You can keep these meals in the fridge or freezer and reheat for a quick meal. When you come home from the grocery store, wash, peel, and cut fruits and vegetables so they are ready to use and eat. This will help reduce food waste. Meal planning Do not eat out or get fast food. Prepare food at home. Make a grocery list and make sure to bring it with you to the store. If you have a smart phone, you could use your phone to create your shopping list. Plan meals and snacks according to a grocery list and budget you create. Use leftovers in your meal plan for the week. Look for recipes where you can cook once and make enough food for two meals. Prepare budget-friendly types of meals like stews, casseroles, and stir-fry dishes. Try some meatless meals or try "no cook" meals like salads. Make sure that half your plate is filled with fruits or vegetables. Choose from fresh, frozen, or canned fruits and vegetables. If eating canned, remember to rinse them before eating. This will remove any excess salt added for packaging. Summary Eating healthy on a budget is possible if you plan your meals according to your budget, purchase according to your budget and grocery list,  and prepare food yourself. Tips for buying more food on a limited budget include buying generic brands, using coupons only for foods you normally buy, and buying healthy items from the bulk bins when available. Tips for buying cheaper food to replace expensive food include choosing cheaper, lean cuts of meat, and buying dried beans and peas. This information is not intended to replace advice given to you by your health care provider. Make sure you discuss any questions you have with your health care provider. Document Revised: 09/26/2020 Document Reviewed: 09/26/2020 Elsevier Patient Education  2022 Else[employer] Inc. Bone Health Bones protect organs, store calcium, anchor muscles, and support the whole body. Keeping your bones strong is important, especially as you get older. You can take actions to help keep your bones strong and healthy. Why is keeping my bones healthy important? Keeping your bones healthy is important because your body constantly replaces bone cells. Cells get old, and new cells take their place. As we age, we lose bone cells because the body may not be able to make enough new cells to replace the old cells. The amount of bone cells and bone tissue you have is referred to as   bone mass. The higher your bone mass, the stronger your bones. The aging process leads to an overall loss of bone mass in the body, which can increase the likelihood of: Joint pain and stiffness. Broken bones. A condition in which the bones become weak and brittle (osteoporosis). A large decline in bone mass occurs in older adults. In women, it occurs about the time of menopause. What actions can I take to keep my bones healthy? Good health habits are important for maintaining healthy bones. This includes eating nutritious foods and exercising regularly. To have healthy bones, you need to get enough of the right minerals and vitamins. Most nutrition experts recommend getting these nutrients from the foods that  you eat. In some cases, taking supplements may also be recommended. Doing certain types of exercise is also important for bone health. What are the nutritional recommendations for healthy bones? Eating a well-balanced diet with plenty of calcium and vitamin D will help to protect your bones. Nutritional recommendations vary from person to person. Ask your health care provider what is healthy for you. Here are some general guidelines. Get enough calcium Calcium is the most important (essential) mineral for bone health. Most people can get enough calcium from their diet, but supplements may be recommended for people who are at risk for osteoporosis. Good sources of calcium include: Dairy products, such as low-fat or nonfat milk, cheese, and yogurt. Dark green leafy vegetables, such as bok choy and broccoli. Calcium-fortified foods, such as orange juice, cereal, bread, soy beverages, and tofu products. Nuts, such as almonds. Follow these recommended amounts for daily calcium intake: Children, age 1-3: 700 mg. Children, age 4-8: 1,000 mg. Children, age 9-13: 1,300 mg. Teens, age 14-18: 1,300 mg. Adults, age 19-50: 1,000 mg. Adults, age 51-70: Men: 1,000 mg. Women: 1,200 mg. Adults, age 71 or older: 1,200 mg. Pregnant and breastfeeding females: Teens: 1,300 mg. Adults: 1,000 mg. Get enough vitamin D Vitamin D is the most essential vitamin for bone health. It helps the body absorb calcium. Sunlight stimulates the skin to make vitamin D, so be sure to get enough sunlight. If you live in a cold climate or you do not get outside often, your health care provider may recommend that you take vitamin D supplements. Good sources of vitamin D in your diet include: Egg yolks. Saltwater fish. Milk and cereal fortified with vitamin D. Follow these recommended amounts for daily vitamin D intake: Children and teens, age 1-18: 600 international units. Adults, age 50 or younger: 400-800 international  units. Adults, age 51 or older: 800-1,000 international units. Get other important nutrients Other nutrients that are important for bone health include: Phosphorus. This mineral is found in meat, poultry, dairy foods, nuts, and legumes. The recommended daily intake for adult men and adult women is 700 mg. Magnesium. This mineral is found in seeds, nuts, dark green vegetables, and legumes. The recommended daily intake for adult men is 400-420 mg. For adult women, it is 310-320 mg. Vitamin K. This vitamin is found in green leafy vegetables. The recommended daily intake is 120 mg for adult men and 90 mg for adult women. What type of physical activity is best for building and maintaining healthy bones? Weight-bearing and strength-building activities are important for building and maintaining healthy bones. Weight-bearing activities cause muscles and bones to work against gravity. Strength-building activities increase the strength of the muscles that support bones. Weight-bearing and muscle-building activities include: Walking and hiking. Jogging and running. Dancing. Gym exercises. Lifting weights. Tennis   and racquetball. Climbing stairs. Aerobics. Adults should get at least 30 minutes of moderate physical activity on most days. Children should get at least 60 minutes of moderate physical activity on most days. Ask your health care provider what type of exercise is best for you. How can I find out if my bone mass is low? Bone mass can be measured with an X-ray test called a bone mineral density (BMD) test. This test is recommended for all women who are age 63 or older. It may also be recommended for: Men who are age 56 or older. People who are at risk for osteoporosis because of: Having bones that break easily. Having a long-term disease that weakens bones, such as kidney disease or rheumatoid arthritis. Having menopause earlier than normal. Taking medicine that weakens bones, such as steroids,  thyroid hormones, or hormone treatment for breast cancer or prostate cancer. Smoking. Drinking three or more alcoholic drinks a day. If you find that you have a low bone mass, you may be able to prevent osteoporosis or further bone loss by changing your diet and lifestyle. Where can I find more information? For more information, check out the following websites: National Osteoporosis Foundation: https://carlson-fletcher.info/ [employer]: www.bones.http://www.myers.net/ International Osteoporosis Foundation: [employer] banker, operational.iofbonehealth.org Summary The aging process leads to an overall loss of bone mass in the body, which can increase the likelihood of broken bones and osteoporosis. Eating a well-balanced diet with plenty of calcium and vitamin D will help to protect your bones. Weight-bearing and strength-building activities are also important for building and maintaining strong bones. Bone mass can be measured with an X-ray test called a bone mineral density (BMD) test. This information is not intended to replace advice given to you by your health care provider. Make sure you discuss any questions you have with your health care provider. Document Revised: 01/10/2018 Document Reviewed: 01/10/2018 Elsevier Patient Education  2022 Elsevier Inc. Constipation, Adult Constipation is when a person has trouble pooping (having a bowel movement). When you have this condition, you may poop fewer than 3 times a week. Your poop (stool) may also be dry, hard, or bigger than normal. Follow these instructions at home: Eating and drinking  Eat foods that have a lot of fiber, such as: Fresh fruits and vegetables. Whole grains. Beans. Eat less of foods that are low in fiber and high in fat and sugar, such as: [place] fries. Hamburgers. Cookies. Candy. Soda. Drink enough fluid to keep your pee (urine) pale yellow. General instructions Exercise regularly or as told by your doctor. Try to do 150 minutes of exercise  each week. Go to the restroom when you feel like you need to poop. Do not hold it in. Take over-the-counter and prescription medicines only as told by your doctor. These include any fiber supplements. When you poop: Do deep breathing while relaxing your lower belly (abdomen). Relax your pelvic floor. The pelvic floor is a group of muscles that support the rectum, bladder, and intestines (as well as the uterus in women). Watch your condition for any changes. Tell your doctor if you notice any. Keep all follow-up visits as told by your doctor. This is important. Contact a doctor if: You have pain that gets worse. You have a fever. You have not pooped for 4 days. You vomit. You are not hungry. You lose weight. You are bleeding from the opening of the butt (anus). You have thin, pencil-like poop. Get help right away if: You have a fever, and your symptoms  suddenly get worse. You leak poop or have blood in your poop. Your belly feels hard or bigger than normal (bloated). You have very bad belly pain. You feel dizzy or you faint. Summary Constipation is when a person poops fewer than 3 times a week, has trouble pooping, or has poop that is dry, hard, or bigger than normal. Eat foods that have a lot of fiber. Drink enough fluid to keep your pee (urine) pale yellow. Take over-the-counter and prescription medicines only as told by your doctor. These include any fiber supplements. This information is not intended to replace advice given to you by your health care provider. Make sure you discuss any questions you have with your health care provider. Document Revised: 11/01/2019 Document Reviewed: 11/01/2019 Elsevier Patient Education  2022 [employer].

## 2021-10-13 NOTE — Progress Notes (Signed)
Gynecology Annual Exam  PCP: Dortha Kern, MD  Chief Complaint:  Chief Complaint  Patient presents with   Gynecologic Exam    History of Present Illness: Patient is a 75 y.o. G1P1001 presents for annual exam. The patient has no complaints today.   LMP: No LMP recorded. Patient is postmenopausal. She denies postmenopausal bleeding.   Review of Systems: Review of Systems  Constitutional:  Negative for chills, fever, malaise/fatigue and weight loss.  HENT:  Negative for congestion, hearing loss and sinus pain.   Eyes:  Negative for blurred vision and double vision.       +change in vision  Respiratory:  Negative for cough, sputum production, shortness of breath and wheezing.   Cardiovascular:  Negative for chest pain, palpitations, orthopnea and leg swelling.  Gastrointestinal:  Positive for constipation. Negative for abdominal pain, diarrhea, nausea and vomiting.  Genitourinary:  Negative for dysuria, flank pain, frequency, hematuria and urgency.  Musculoskeletal:  Negative for back pain, falls and joint pain.  Skin:  Negative for itching and rash.  Neurological:  Negative for dizziness and headaches.  Psychiatric/Behavioral:  Negative for depression, substance abuse and suicidal ideas. The patient is not nervous/anxious.    Past Medical History:  Past Medical History:  Diagnosis Date   Hypertension     Past Surgical History:  Past Surgical History:  Procedure Laterality Date   CATARACT EXTRACTION, BILATERAL Bilateral    CERVICAL POLYPECTOMY  02/06/2020   Procedure:   POLYPECTOMY;  Surgeon: Natale Milch, MD;  Location: [facility];  Service: Gynecology;;   CHOLECYSTECTOMY     COLONOSCOPY WITH PROPOFOL N/A 10/17/2015   Procedure: COLONOSCOPY WITH PROPOFOL;  Surgeon: Midge Minium, MD;  Location: [facility];  Service: Endoscopy;  Laterality: N/A;   HYSTEROSCOPY WITH D & C N/A 02/06/2020   Procedure: DILATATION AND CURETTAGE /HYSTEROSCOPY;  Surgeon: Natale Milch, MD;  Location: [facility];  Service: Gynecology;  Laterality: N/A;   POLYPECTOMY  10/17/2015   Procedure: POLYPECTOMY;  Surgeon: Midge Minium, MD;  Location: [facility];  Service: Endoscopy;;  cecum polyp   ROTATOR CUFF REPAIR Right    SHOULDER ARTHROSCOPY WITH OPEN ROTATOR CUFF REPAIR Left 03/18/2016   Procedure: SHOULDER ARTHROSCOPY WITH OPEN ROTATOR CUFF REPAIR;  Surgeon: Deeann Saint, MD;  Location: [facility];  Service: Orthopedics;  Laterality: Left;   TONSILLECTOMY      Gynecologic History:  No LMP recorded. Patient is postmenopausal.    Obstetric History: G1P1001  Family History:  Family History  Problem Relation Age of Onset   Colon cancer Sister    Breast cancer Neg Hx     Social History:  Social History   Socioeconomic History   Marital status: Widowed    Spouse name: Not on file   Number of children: Not on file   Years of education: Not on file   Highest education level: Not on file  Occupational History   Not on file  Tobacco Use   Smoking status: Never   Smokeless tobacco: Never  Vaping Use   Vaping Use: Never used  Substance and Sexual Activity   Alcohol use: No   Drug use: No   Sexual activity: Not Currently    Birth control/protection: Post-menopausal  Other Topics Concern   Not on file  Social History Narrative   Not on file   Social Determinants of Health   Financial Resource Strain: Not on file  Food Insecurity: Not on file  Transportation Needs: Not  on file  Physical Activity: Not on file  Stress: Not on file  Social Connections: Not on file  Intimate Partner Violence: Not on file    Allergies:  Allergies  Allergen Reactions   Hydrocodone Itching and Nausea And Vomiting   Sulfa Antibiotics Other (See Comments)    headaches   Penicillins Rash    Did it involve swelling of the face/tongue/throat, SOB, or low BP? No Did it involve sudden or severe rash/hives, skin peeling, or any reaction on the inside of your  mouth or nose? Yes Did you need to seek medical attention at a hospital or doctor's office? Yes When did it last happen? ~8 years ago If all above answers are "NO", may proceed with cephalosporin use.     Medications: Prior to Admission medications   Medication Sig Start Date End Date Taking? Authorizing Provider  acetaminophen (TYLENOL) 325 MG tablet Take 325 mg by mouth every 6 (six) hours as needed (for pain.).    [provider]  alendronate (FOSAMAX) 70 MG tablet TAKE 1 TABLET BY MOUTH ONCE A WEEK WITH FULL GLASS OF WATER ON AN EMPTY STOMACH 09/29/21   Anel Purohit R, MD  amLODipine (NORVASC) 5 MG tablet Take 5 mg by mouth daily. AM    [provider]  docusate sodium (COLACE) 100 MG capsule Take 100-200 mg by mouth daily as needed. 05/13/21   [provider]  eszopiclone (LUNESTA) 2 MG TABS tablet Take 2 mg by mouth at bedtime as needed. 12/25/19   [provider]  losartan (COZAAR) 50 MG tablet Take 50 mg by mouth daily.    [provider]  metoCLOPramide (REGLAN) 10 MG tablet Take 1 tablet (10 mg total) by mouth 3 (three) times daily before meals. 02/14/20   Natale Milch, MD  Multiple Vitamin (MULTIVITAMIN) tablet Take 1 tablet by mouth daily.    [provider]  PARoxetine (PAXIL) 20 MG tablet TAKE 1 TABLET(20 MG) BY MOUTH DAILY 08/21/21   Tyquasia Pant R, MD  sertraline (ZOLOFT) 50 MG tablet Take 50 mg by mouth daily. 12/30/19   [provider]    Physical Exam Vitals: Blood pressure 130/72, height 5\' 4"  (1.626 m), weight 194 lb (88 kg).  Physical Exam Constitutional:      Appearance: She is well-developed.  Genitourinary:     Genitourinary Comments: External: Normal appearing vulva. No lesions noted.   Bimanual examination: Uterus midline, non-tender, normal in size, shape and contour.  No CMT. No adnexal masses. No adnexal tenderness. Pelvis not fixed.  Breast Exam: breast equal without skin  changes, nipple discharge, breast lump or enlarged lymph nodes   HENT:     Head: Normocephalic and atraumatic.  Neck:     Thyroid: No thyromegaly.  Cardiovascular:     Rate and Rhythm: Normal rate and regular rhythm.     Heart sounds: Normal heart sounds.  Pulmonary:     Effort: Pulmonary effort is normal.     Breath sounds: Normal breath sounds.  Abdominal:     General: Bowel sounds are normal. There is no distension.     Palpations: Abdomen is soft. There is no mass.  Musculoskeletal:     Cervical back: Neck supple.  Neurological:     Mental Status: She is alert and oriented to person, place, and time.  Skin:    General: Skin is warm and dry.  Psychiatric:        Behavior: Behavior normal.  Thought Content: Thought content normal.        Judgment: Judgment normal.  Vitals reviewed.     Female chaperone present for pelvic and breast  portions of the physical exam  Assessment: 75 y.o. G1P1001 routine annual exam  Plan: Problem List Items Addressed This Visit   None Visit Diagnoses     Encounter for annual routine gynecological examination    -  Primary   Breast cancer screening by mammogram       Colon cancer screening       Relevant Orders   Ambulatory referral to Gastroenterology   Encounter for gynecological examination with abnormal finding       Constipation, unspecified constipation type       Relevant Orders   Ambulatory referral to Gastroenterology   Enlarged uterus       Relevant Orders   US PELVIC COMPLETE WITH TRANSVAGINAL (Completed)       1) Mammogram - recommend yearly screening mammogram.  Mammogram is up to date.  2) STI screening was offered and declined  3) Pap smears have been discontinued  4) Colonoscopy -- referral to GI physician placed   5) Routine healthcare maintenance including cholesterol, diabetes screening discussed managed by PCP   Adelene Idler MD, [facility] 04/13/2022 2:53 PM

## 2021-11-07 ENCOUNTER — Ambulatory Visit (INDEPENDENT_AMBULATORY_CARE_PROVIDER_SITE_OTHER): Payer: Medicare Other

## 2021-11-07 ENCOUNTER — Other Ambulatory Visit: Payer: Self-pay

## 2021-11-07 ENCOUNTER — Ambulatory Visit (INDEPENDENT_AMBULATORY_CARE_PROVIDER_SITE_OTHER): Payer: Medicare Other | Admitting: Obstetrics and Gynecology

## 2021-11-07 ENCOUNTER — Encounter: Payer: Self-pay | Admitting: Obstetrics and Gynecology

## 2021-11-07 VITALS — BP 130/70 | Ht 64.0 in | Wt 194.0 lb

## 2021-11-07 DIAGNOSIS — N852 Hypertrophy of uterus: Secondary | ICD-10-CM | POA: Diagnosis not present

## 2021-11-07 DIAGNOSIS — N771 Vaginitis, vulvitis and vulvovaginitis in diseases classified elsewhere: Secondary | ICD-10-CM | POA: Diagnosis not present

## 2021-11-07 DIAGNOSIS — R875 Abnormal microbiological findings in specimens from female genital organs: Secondary | ICD-10-CM | POA: Diagnosis not present

## 2021-11-07 DIAGNOSIS — L292 Pruritus vulvae: Secondary | ICD-10-CM | POA: Diagnosis not present

## 2021-11-07 NOTE — Progress Notes (Signed)
Patient ID: Brianna [name], female   DOB: 10-Mar-1946, 75 y.o.   MRN: [id number]  Reason for Consult: Gynecologic Exam   Referred by Lynnell Jude, MD  Subjective:     HPI:  Brianna [name] is a 75 y.o. female she is following up after a vaginal ultrasound.  For postmenopausal bleeding.  She reports that she has had no further postmenopausal bleeding.  She notes that she has had some vulvar itching since her appointment with me last.    Past Medical History:  Diagnosis Date   Hypertension    Family History  Problem Relation Age of Onset   Colon cancer Sister    Breast cancer Neg Hx    Past Surgical History:  Procedure Laterality Date   CATARACT EXTRACTION, BILATERAL Bilateral    CERVICAL POLYPECTOMY  02/06/2020   Procedure:   POLYPECTOMY;  Surgeon: Homero Fellers, MD;  Location: [facility];  Service: Gynecology;;   CHOLECYSTECTOMY     COLONOSCOPY WITH PROPOFOL N/A 10/17/2015   Procedure: COLONOSCOPY WITH PROPOFOL;  Surgeon: Lucilla Lame, MD;  Location: [place];  Service: Endoscopy;  Laterality: N/A;   HYSTEROSCOPY WITH D & C N/A 02/06/2020   Procedure: DILATATION AND CURETTAGE /HYSTEROSCOPY;  Surgeon: Homero Fellers, MD;  Location: [facility];  Service: Gynecology;  Laterality: N/A;   POLYPECTOMY  10/17/2015   Procedure: POLYPECTOMY;  Surgeon: Lucilla Lame, MD;  Location: [place];  Service: Endoscopy;;  cecum polyp   ROTATOR CUFF REPAIR Right    SHOULDER ARTHROSCOPY WITH OPEN ROTATOR CUFF REPAIR Left 03/18/2016   Procedure: SHOULDER ARTHROSCOPY WITH OPEN ROTATOR CUFF REPAIR;  Surgeon: Earnestine Leys, MD;  Location: [facility];  Service: Orthopedics;  Laterality: Left;   TONSILLECTOMY      Short Social History:  Social History   Tobacco Use   Smoking status: Never   Smokeless tobacco: Never  Substance Use Topics   Alcohol use: No    Allergies  Allergen Reactions   Hydrocodone Itching and Nausea And Vomiting   Sulfa Antibiotics  Other (See Comments)    headaches   Penicillins Rash    Did it involve swelling of the face/tongue/throat, SOB, or low BP? No Did it involve sudden or severe rash/hives, skin peeling, or any reaction on the inside of your mouth or nose? Yes Did you need to seek medical attention at a hospital or doctor's office? Yes When did it last happen? ~8 years ago If all above answers are "NO", may proceed with cephalosporin use.     Current Outpatient Medications  Medication Sig Dispense Refill   acetaminophen (TYLENOL) 325 MG tablet Take 325 mg by mouth every 6 (six) hours as needed (for pain.).     alendronate (FOSAMAX) 70 MG tablet TAKE 1 TABLET BY MOUTH ONCE A WEEK WITH FULL GLASS OF WATER ON AN EMPTY STOMACH 28 tablet 11   amLODipine (NORVASC) 5 MG tablet Take 5 mg by mouth daily. AM     docusate sodium (COLACE) 100 MG capsule Take 100-200 mg by mouth daily as needed.     eszopiclone (LUNESTA) 2 MG TABS tablet Take 2 mg by mouth at bedtime as needed.     losartan (COZAAR) 50 MG tablet Take 50 mg by mouth daily.     metoCLOPramide (REGLAN) 10 MG tablet Take 1 tablet (10 mg total) by mouth 3 (three) times daily before meals. 30 tablet 0   Multiple Vitamin (MULTIVITAMIN) tablet Take 1 tablet by mouth daily.  PARoxetine (PAXIL) 20 MG tablet TAKE 1 TABLET(20 MG) BY MOUTH DAILY 30 tablet 11   sertraline (ZOLOFT) 50 MG tablet Take 50 mg by mouth daily.     No current facility-administered medications for this visit.    Review of Systems  Constitutional: Negative for chills, fatigue, fever and unexpected weight change.  HENT: Negative for trouble swallowing.  Eyes: Negative for loss of vision.  Respiratory: Negative for cough, shortness of breath and wheezing.  Cardiovascular: Negative for chest pain, leg swelling, palpitations and syncope.  GI: Negative for abdominal pain, blood in stool, diarrhea, nausea and vomiting.  GU: Negative for difficulty urinating, dysuria, frequency and hematuria.   Musculoskeletal: Negative for back pain, leg pain and joint pain.  Skin: Negative for rash.  Neurological: Negative for dizziness, headaches, light-headedness, numbness and seizures.  Psychiatric: Negative for behavioral problem, confusion, depressed mood and sleep disturbance.       Objective:  Objective   Vitals:   11/07/21 1034  BP: 130/70  Weight: 194 lb (88 kg)  Height: 5\' 4"  (1.626 m)   Body mass index is 33.3 kg/m.  Physical Exam Vitals and nursing note reviewed. Exam conducted with a chaperone present.  Constitutional:      Appearance: Normal appearance. She is well-developed.  HENT:     Head: Normocephalic and atraumatic.  Eyes:     Extraocular Movements: Extraocular movements intact.     Pupils: Pupils are equal, round, and reactive to light.  Cardiovascular:     Rate and Rhythm: Normal rate and regular rhythm.  Pulmonary:     Effort: Pulmonary effort is normal. No respiratory distress.     Breath sounds: Normal breath sounds.  Abdominal:     General: Abdomen is flat.     Palpations: Abdomen is soft.  Genitourinary:    Comments: External: Normal appearing vulva. No lesions noted. Swab collected Musculoskeletal:        General: No signs of injury.  Skin:    General: Skin is warm and dry.  Neurological:     Mental Status: She is alert and oriented to person, place, and time.  Psychiatric:        Behavior: Behavior normal.        Thought Content: Thought content normal.        Judgment: Judgment normal.    Assessment/Plan:     75 year old with complaint of postmenopausal bleeding.  Pelvic ultrasound today reassuring with a endometrium measuring 2 mm.  No biopsy at this time.  Follow-up as needed if bleeding continues.  New swab collected for complaints of vulvar itching.  We will treat if necessary.  Discussed use of over-the-counter Monistat as needed.  More than 10 minutes were spent face to face with the patient in the room, reviewing the medical  record, labs and images, and coordinating care for the patient. The plan of management was discussed in detail and counseling was provided.      73 MD [facility], [facility] 11/07/2021 11:12 AM

## 2021-11-09 LAB — NUSWAB BV AND CANDIDA, NAA
Candida albicans, NAA: NEGATIVE
Candida glabrata, NAA: NEGATIVE

## 2022-03-28 IMAGING — CT CT CERVICAL SPINE W/O CM
3 of 4 series · 10 of 33 positions shown, 12 images · non-contrast
Comparison: None.

CLINICAL DATA: Fall out of bed with left orbital injury.

EXAM:
CT HEAD WITHOUT CONTRAST
CT MAXILLOFACIAL WITHOUT CONTRAST
CT CERVICAL SPINE WITHOUT CONTRAST
TECHNIQUE: Multidetector CT imaging of the head, cervical spine, and
maxillofacial structures were performed using the standard protocol
without intravenous contrast. Multiplanar CT image reconstructions
of the cervical spine and maxillofacial structures were also
generated.

[Series 5: sagittal bone · sagittal · 0.24mm/px · 5 of 76 slices shown, 6 images]
[im 26/76  bone]
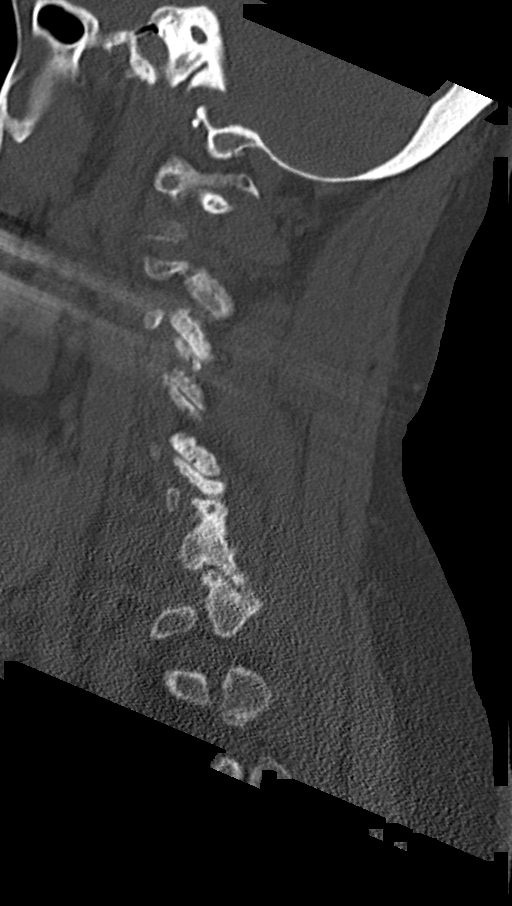
[im 32/76  bone]
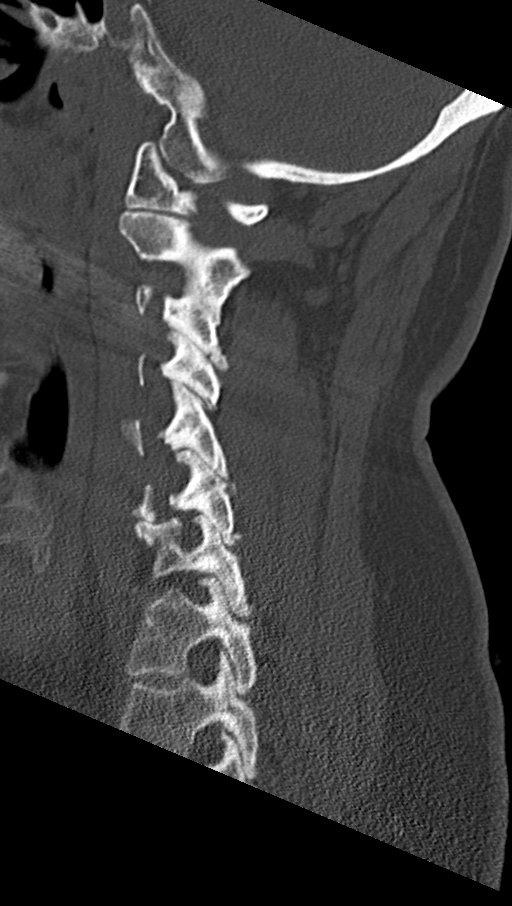
[im 38/76  soft-tissue]
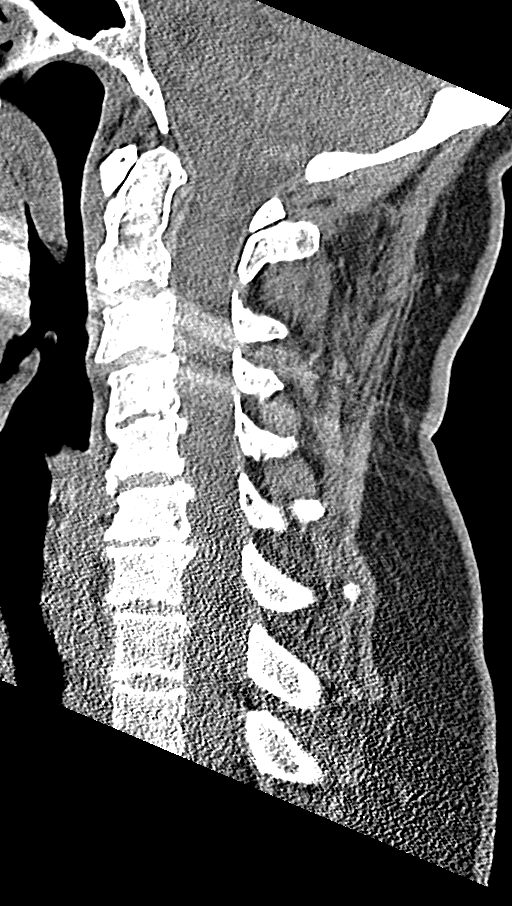
[im 38/76  bone]
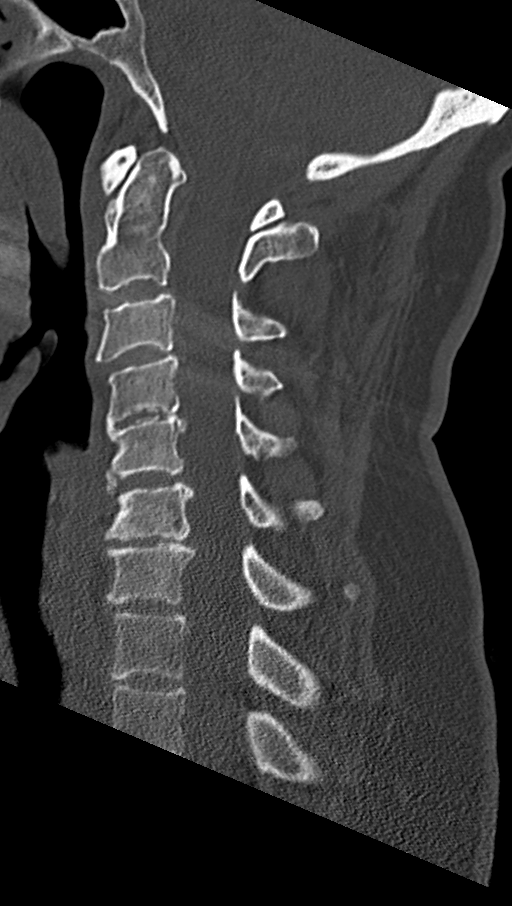
[im 44/76  bone]
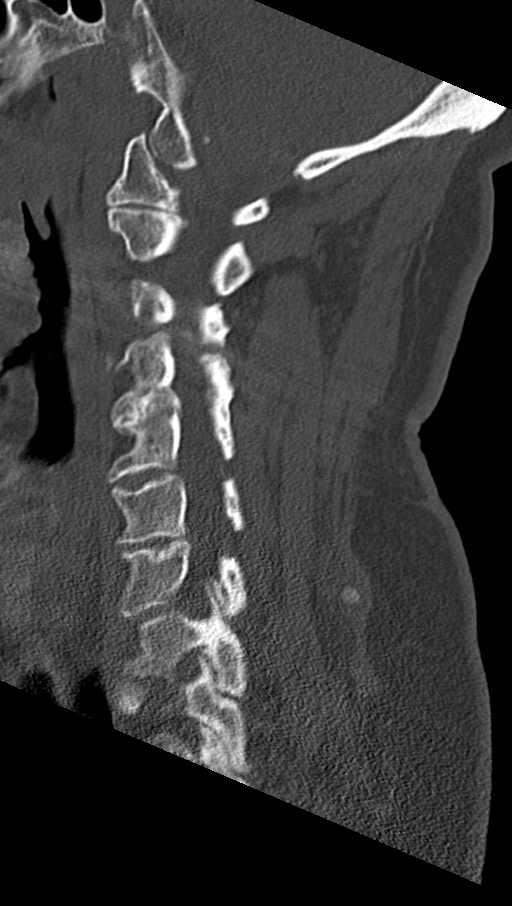
[im 51/76  bone]
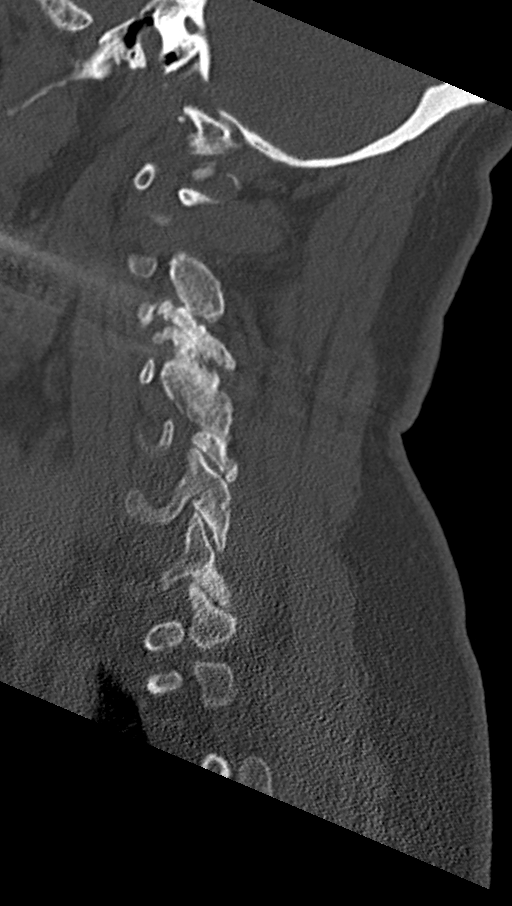

[Series 6: coronal bone · coronal · 0.30mm/px · 3 of 62 slices shown]
[im 13/62  bone]
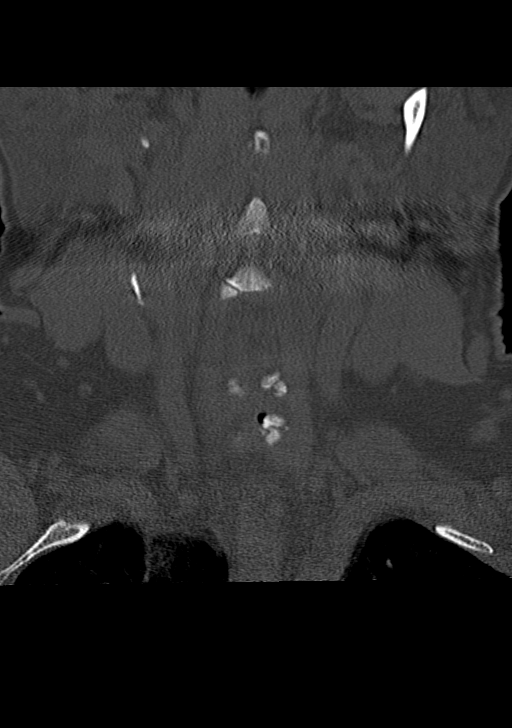
[im 25/62  bone]
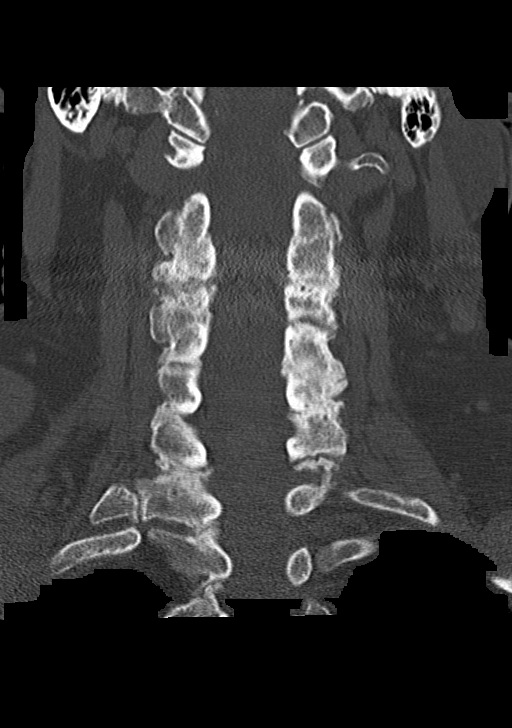
[im 37/62  bone]
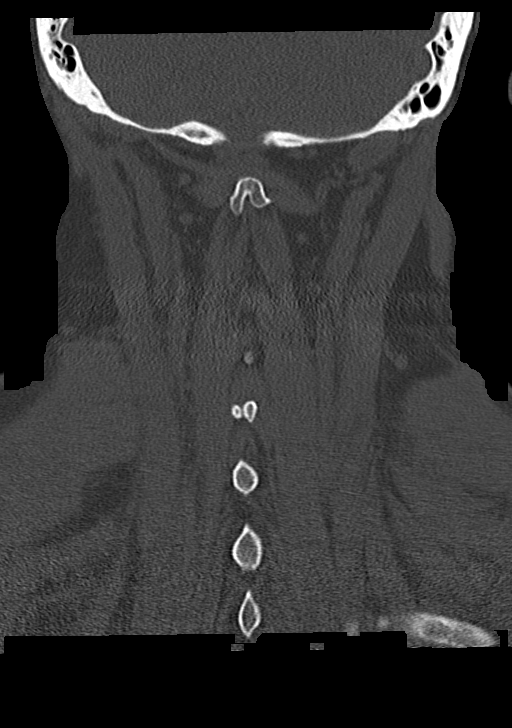

[Series 7: orthogonal axials · axial · 0.24mm/px · z∈[-257,-171]mm · 2 of 109 slices shown, 3 images]
[im 31/109  soft-tissue]
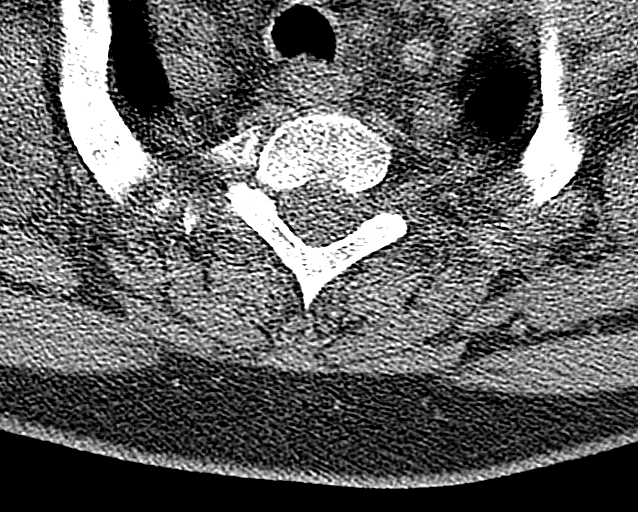
[im 31/109  bone]
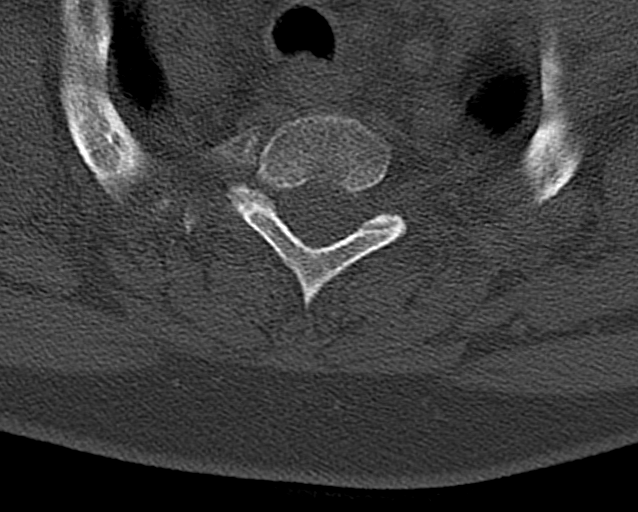
[im 78/109  bone]
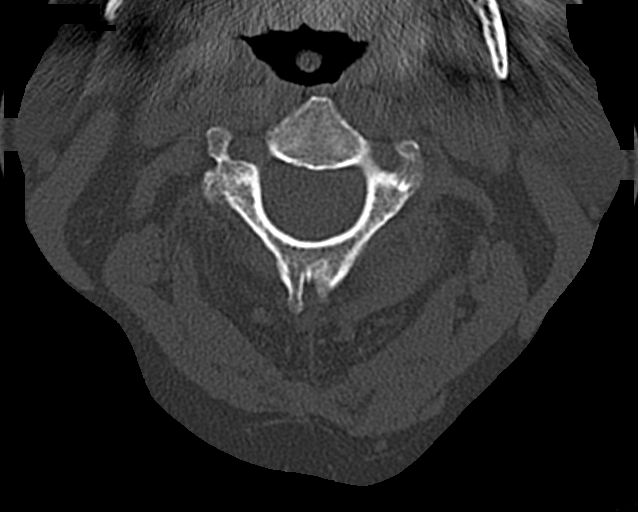

[10 of 33 positions shown; findings below may reference images not displayed]

FINDINGS: CT HEAD FINDINGS

Brain: There is no evidence for acute hemorrhage, hydrocephalus,
mass lesion, or abnormal extra-axial fluid collection. No definite
CT evidence for acute infarction. Diffuse loss of parenchymal volume
is consistent with atrophy. Patchy low attenuation in the deep
hemispheric and periventricular white matter is nonspecific, but
likely reflects chronic microvascular ischemic demyelination.

Vascular: No hyperdense vessel or unexpected calcification.

Skull: No evidence for fracture. No worrisome lytic or sclerotic
lesion.

Other: None.

CT MAXILLOFACIAL FINDINGS

Osseous: No fracture or mandibular dislocation. No destructive
process.

Orbits: Left globe is deformed with irregular contour and smaller
size compared to the contralateral side. There is some edema/fluid
in the region of the eyelids. Intra orbital fat is preserved. No
orbital wall fracture.

Sinuses: The visualized paranasal sinuses and mastoid air cells are
clear.

Soft tissues: As above.

CT CERVICAL SPINE FINDINGS

Alignment: Straightening of normal cervical lordosis evident

Skull base and vertebrae: No acute fracture. No primary bone lesion
or focal pathologic process.

Soft tissues and spinal canal: No prevertebral fluid or swelling. No
visible canal hematoma.

Disc levels: Loss of disc height noted C4-5 and C6-7. Diffuse facet
osteoarthritis noted bilaterally.

Upper chest: Unremarkable.

Other: None.
IMPRESSION: 1. Left globe is deformed and smaller in size than the contralateral
side. CT imaging features highly suspicious for left sided globe
injury. No associated orbital wall or maxillary sinus fracture.
2. No acute intracranial abnormality. Atrophy with chronic small
vessel white matter ischemic disease.
3. Degenerative changes in the cervical spine without acute
fracture.

I personally called the results of this study to Dr. Gabili at

## 2022-04-10 NOTE — Progress Notes (Addendum)
[facility] - Clinic Note  04/13/2022     CHIEF COMPLAINT Patient presents for Retina Evaluation   HISTORY OF PRESENT ILLNESS: Brianna [name] is a 76 y.o. female who presents to the clinic today for:   HPI     Retina Evaluation   In left eye.  This started 7 months ago.  Associated Symptoms Trauma.  I, the attending physician,  performed the HPI with the patient and updated documentation appropriately.        Comments   Patient here for Retina Evaluation for 2nd opinion. Patient states vision no good OS. Can't really see anything. When sees tv when colors red or green come on can't tell what it is. Has had 3 surgeries OS in at [facility]. In September fell out of bed. Poked hole in OS and broke ribs. One surgery was for leaking fluid. Had to be repaired. At times for a brief moment could see then go away like in a flash. Vision as come from not seeing anything to seeing some colors. Has gained some vision since surgery. Add aspirin 81mg , HCTZ 12.5 mg one a day. Black cohosh 540 mg and melatonin 5 mg to medicines taking and on prednisone QID OS.      Last edited by Rennis Chris, MD on 04/13/2022 10:15 PM.    Patient would like a second opinion just for "peace of mind".  Using Prednisolone QID OS.  Last sx 12/2021.  Vision, "shadows" comes and goes.  Referring physician: Self-Referral   HISTORICAL INFORMATION:   Selected notes from the MEDICAL RECORD NUMBER Referred by Self for second opinion  Ocular Hx- 9.14.22 - Ruptured globe OS s/p repair -- fell out of bed and hit night stand 10.24.22 - PPV w/ 360 retinectomy, 1000 cs SO for total RD 01.23.23 - PPV w/ MP and C3F8 gas (complicated by choroidal effusions) for recurrent RD w/ PVR 03.29.23 - POM2 visit BCVA LP, IOP 2    CURRENT MEDICATIONS: No current outpatient medications on file. (Ophthalmic Drugs)   No current facility-administered medications for this visit. (Ophthalmic Drugs)   Current  Outpatient Medications (Other)  Medication Sig   acetaminophen (TYLENOL) 325 MG tablet Take 325 mg by mouth every 6 (six) hours as needed (for pain.).   alendronate (FOSAMAX) 70 MG tablet TAKE 1 TABLET BY MOUTH ONCE A WEEK WITH FULL GLASS OF WATER ON AN EMPTY STOMACH   amLODipine (NORVASC) 5 MG tablet Take 5 mg by mouth daily. AM   docusate sodium (COLACE) 100 MG capsule Take 100-200 mg by mouth daily as needed.   losartan (COZAAR) 50 MG tablet Take 50 mg by mouth daily.   PARoxetine (PAXIL) 20 MG tablet TAKE 1 TABLET(20 MG) BY MOUTH DAILY   eszopiclone (LUNESTA) 2 MG TABS tablet Take 2 mg by mouth at bedtime as needed. (Patient not taking: Reported on 04/13/2022)   metoCLOPramide (REGLAN) 10 MG tablet Take 1 tablet (10 mg total) by mouth 3 (three) times daily before meals. (Patient not taking: Reported on 04/13/2022)   Multiple Vitamin (MULTIVITAMIN) tablet Take 1 tablet by mouth daily. (Patient not taking: Reported on 04/13/2022)   sertraline (ZOLOFT) 50 MG tablet Take 50 mg by mouth daily. (Patient not taking: Reported on 04/13/2022)   No current facility-administered medications for this visit. (Other)   REVIEW OF SYSTEMS: ROS   Positive for: Musculoskeletal, Eyes Negative for: Constitutional, Gastrointestinal, Neurological, Skin, Genitourinary, HENT, Endocrine, Cardiovascular, Respiratory, Psychiatric, Allergic/Imm, Heme/Lymph Last edited by Yetta Flock,  Zella Ball, COA on 04/13/2022  9:48 AM.     ALLERGIES Allergies  Allergen Reactions   Hydrocodone Itching and Nausea And Vomiting   Sulfa Antibiotics Other (See Comments)    headaches   Penicillins Rash    Did it involve swelling of the face/tongue/throat, SOB, or low BP? No Did it involve sudden or severe rash/hives, skin peeling, or any reaction on the inside of your mouth or nose? Yes Did you need to seek medical attention at a hospital or doctor's office? Yes When did it last happen? ~8 years ago If all above answers are "NO", may proceed  with cephalosporin use.    PAST MEDICAL HISTORY Past Medical History:  Diagnosis Date   Hypertension    Past Surgical History:  Procedure Laterality Date   CATARACT EXTRACTION, BILATERAL Bilateral    CERVICAL POLYPECTOMY  02/06/2020   Procedure:   POLYPECTOMY;  Surgeon: Natale Milch, MD;  Location: [facility];  Service: Gynecology;;   CHOLECYSTECTOMY     COLONOSCOPY WITH PROPOFOL N/A 10/17/2015   Procedure: COLONOSCOPY WITH PROPOFOL;  Surgeon: Midge Minium, MD;  Location: [facility];  Service: Endoscopy;  Laterality: N/A;   HYSTEROSCOPY WITH D & C N/A 02/06/2020   Procedure: DILATATION AND CURETTAGE /HYSTEROSCOPY;  Surgeon: Natale Milch, MD;  Location: [facility];  Service: Gynecology;  Laterality: N/A;   POLYPECTOMY  10/17/2015   Procedure: POLYPECTOMY;  Surgeon: Midge Minium, MD;  Location: [facility];  Service: Endoscopy;;  cecum polyp   ROTATOR CUFF REPAIR Right    SHOULDER ARTHROSCOPY WITH OPEN ROTATOR CUFF REPAIR Left 03/18/2016   Procedure: SHOULDER ARTHROSCOPY WITH OPEN ROTATOR CUFF REPAIR;  Surgeon: Deeann Saint, MD;  Location: [facility];  Service: Orthopedics;  Laterality: Left;   TONSILLECTOMY      FAMILY HISTORY Family History  Problem Relation Age of Onset   Colon cancer Sister    Breast cancer Neg Hx    SOCIAL HISTORY Social History   Tobacco Use   Smoking status: Never   Smokeless tobacco: Never  Vaping Use   Vaping Use: Never used  Substance Use Topics   Alcohol use: No   Drug use: No         OPHTHALMIC EXAM:  Base Eye Exam     Visual Acuity (Snellen - Linear)       Right Left   Dist cc 20/20 CF at 2'   Dist ph cc  NI    Correction: Glasses         Tonometry (Tonopen, 9:32 AM)       Right Left   Pressure 18 low/soft    Unable to assess: Yes  Unable to get pressure in OS. Patient states it usually reads a 2 or 3.        Pupils       Dark Light Shape React APD   Right 3 2 Round Brisk None   Left 5 5  Irregular NR None         Visual Fields (Counting fingers)       Left Right     Full   Restrictions Total superior nasal, inferior nasal deficiencies; Partial inner superior temporal, inferior temporal deficiencies          Extraocular Movement       Right Left    Full, Ortho Full, Ortho         Neuro/Psych     Oriented x3: Yes   Mood/Affect: Normal  Dilation     Both eyes: 1.0% Mydriacyl, 2.5% Phenylephrine @ 9:32 AM           Slit Lamp and Fundus Exam     External Exam       Right Left   External Normal Normal         Slit Lamp Exam       Right Left   Lids/Lashes Dermatochalasis - upper lid, Meibomian gland dysfunction Dermatochalasis - upper lid, Meibomian gland dysfunction   Conjunctiva/Sclera Pinguecula nasal and temporal White and quiet   Cornea 2+ Punctate epithelial erosions, Well healed temporal cataract wound Arcus, linear paracentral thinning, irregular epith, well healed cataract wound, mild endo pigment   Anterior Chamber Deep and quiet Deep and quiet   Iris Round and dilated Moderately dilated, Focal PAS 0200, +NVI 0200   Lens PC IOL in good position, 1+ Posterior capsular opacification PC IOL in good position, 3-4+ Posterior capsular opacification, +pigment deposition, small silicone gas bubble superiorly   Anterior Vitreous Vitreous syneresis, Posterior vitreous detachment post vitrectomy         Fundus Exam       Right Left   Disc Pink and sharp obscured   C/D Ratio 0.2    Macula Flat, Blunted foveal reflex, RPE mottling, Drusen, No heme or edema total RD   Vessels Attenuated, Tortuous hazy view; attenuated   Periphery Attached, No heme Very hazy view, 360 SRF; ?choroidal effusion, temporal retinotomy edge lifted with positive fibrosis and bullous SRF           Refraction     Wearing Rx       Sphere Cylinder Axis Add   Right +0.50 +0.50 007 +2.25   Left +0.00 +0.50 150 +2.25         Manifest Refraction        Sphere Dist VA   Right     Left +/-3.00 NI            IMAGING AND PROCEDURES  Imaging and Procedures for 04/13/2022  OCT, Retina - OU - Both Eyes       Right Eye Quality was good. Central Foveal Thickness: 277. Findings include normal foveal contour, retinal drusen , no IRF, no SRF.   Left Eye Findings include (No images obtained ).   Notes *Images captured and stored on drive  Diagnosis / Impression:  OD: NFP; no IRF/SRF; +drusen OS: no image obtained  Clinical management:  See below  Abbreviations: NFP - Normal foveal profile. CME - cystoid macular edema. PED - pigment epithelial detachment. IRF - intraretinal fluid. SRF - subretinal fluid. EZ - ellipsoid zone. ERM - epiretinal membrane. ORA - outer retinal atrophy. ORT - outer retinal tubulation. SRHM - subretinal hyper-reflective material. IRHM - intraretinal hyper-reflective material            ASSESSMENT/PLAN:    ICD-10-CM   1. Ruptured globe of left eye, sequela  S05.32XS     2. 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 Pseudophakia of both eyes  Z96.1       1-3. Traumatic eye injury OS -- open globe injury + retinal detachment  - pt self-referred for 2nd opinion following complex post traumatic eye injury course OS  - 9.14.22 - Ruptured globe OS s/p repair -- fell out  of bed and hit night stand - 10.24.22 - PPV w/ 360 retinectomy, 1000 cs SO for total RD - 01.23.23 - PPV w/ MP and C3F8 gas (complicated by choroidal effusions) for recurrent RD w/ PVR - 03.29.23 - POM2 visit BCVA LP, IOP 2 - today, BCVA CF 2' OS w/ IOP unreadable by tonopen - exam shows well healed open globe injury/scleral lac (ST quadrant) -- pre-phthisis - dilated exam shows total RD with ?closed funnel obscuring optic disc - temporal retinectomy edge lifted off with significant fibrosis and bullous SRF -  discussed findings, very guarded prognosis, and treatment options -- ?risk vs benefit of repeat PPV w/ silicon oil -- may be able to stave off phthisis, but may not... may be able to reattach retina, but may not improve vision significantly... - recommend continued expert f/u care w/ Dr. Beckey Downing - we are happy to see pt should the need ever arise -- can f/u here prn  4. Retinal drusen OD  5. Pseudophakia OU  - s/p CE/IOL OU ([facility]is)  - IOLs in good position  - OS w/ significant PCO  Ophthalmic Meds Ordered this visit:  No orders of the defined types were placed in this encounter.    Return if symptoms worsen or fail to improve.  There are no Patient Instructions on file for this visit.   Explained the diagnoses, plan, and follow up with the patient and they expressed understanding.  Patient expressed understanding of the importance of proper follow up care.  This document serves as a record of services personally performed by Karie Chimera, MD, PhD. It was created on their behalf by Joni Reining, an ophthalmic technician. The creation of this record is the provider's dictation and/or activities during the visit.    Electronically signed by: Joni Reining COA, 04/13/22  10:28 PM    Karie Chimera, M.D., Ph.D. Diseases & Surgery of the Retina and Vitreous [facility]  I have reviewed the above documentation for accuracy and completeness, and I agree with the above. Karie Chimera, M.D., Ph.D. 04/13/22 10:43 PM   Abbreviations: M myopia (nearsighted); A astigmatism; H hyperopia (farsighted); P presbyopia; Mrx spectacle prescription;  CTL contact lenses; OD right eye; OS left eye; OU both eyes  XT exotropia; ET esotropia; PEK punctate epithelial keratitis; PEE punctate epithelial erosions; DES dry eye syndrome; MGD meibomian gland dysfunction; ATs artificial tears; PFAT's preservative free artificial tears; NSC nuclear sclerotic cataract; PSC posterior  subcapsular cataract; ERM epi-retinal membrane; PVD posterior vitreous detachment; RD retinal detachment; DM diabetes mellitus; DR diabetic retinopathy; NPDR non-proliferative diabetic retinopathy; PDR proliferative diabetic retinopathy; CSME clinically significant macular edema; DME diabetic macular edema; dbh dot blot hemorrhages; CWS cotton wool spot; POAG primary open angle glaucoma; C/D cup-to-disc ratio; HVF humphrey visual field; GVF goldmann visual field; OCT optical coherence tomography; IOP intraocular pressure; BRVO Branch retinal vein occlusion; CRVO central retinal vein occlusion; CRAO central retinal artery occlusion; BRAO branch retinal artery occlusion; RT retinal tear; SB scleral buckle; PPV pars plana vitrectomy; VH Vitreous hemorrhage; PRP panretinal laser photocoagulation; IVK intravitreal kenalog; VMT vitreomacular traction; MH Macular hole;  NVD neovascularization of the disc; NVE neovascularization elsewhere; AREDS age related eye disease study; ARMD age related macular degeneration; POAG primary open angle glaucoma; EBMD epithelial/anterior basement membrane dystrophy; ACIOL anterior chamber intraocular lens; IOL intraocular lens; PCIOL posterior chamber intraocular lens; Phaco/IOL phacoemulsification with intraocular lens placement; PRK photorefractive keratectomy; LASIK laser assisted in situ keratomileusis; HTN hypertension; DM diabetes  mellitus; COPD chronic obstructive pulmonary disease

## 2022-04-13 ENCOUNTER — Encounter (INDEPENDENT_AMBULATORY_CARE_PROVIDER_SITE_OTHER): Payer: Self-pay | Admitting: Ophthalmology

## 2022-04-13 ENCOUNTER — Ambulatory Visit (INDEPENDENT_AMBULATORY_CARE_PROVIDER_SITE_OTHER): Payer: PPO | Admitting: Ophthalmology

## 2022-04-13 DIAGNOSIS — H44522 Atrophy of globe, left eye: Secondary | ICD-10-CM

## 2022-04-13 DIAGNOSIS — H35361 Drusen (degenerative) of macula, right eye: Secondary | ICD-10-CM | POA: Diagnosis not present

## 2022-04-13 DIAGNOSIS — S0532XS Ocular laceration without prolapse or loss of intraocular tissue, left eye, sequela: Secondary | ICD-10-CM

## 2022-04-13 DIAGNOSIS — H3322 Serous retinal detachment, left eye: Secondary | ICD-10-CM | POA: Diagnosis not present

## 2022-04-13 DIAGNOSIS — Z961 Presence of intraocular lens: Secondary | ICD-10-CM

## 2022-04-13 DIAGNOSIS — H3581 Retinal edema: Secondary | ICD-10-CM

## 2022-10-23 ENCOUNTER — Telehealth: Payer: Self-pay

## 2022-10-23 NOTE — Telephone Encounter (Signed)
Received a fax from [facility] for prescription Alendronate.  Denied due to Dr. Gilman Schmidt no longer at this practice and she would need an appointment.

## 2024-02-08 ENCOUNTER — Ambulatory Visit (INDEPENDENT_AMBULATORY_CARE_PROVIDER_SITE_OTHER): Payer: TRICARE For Life (TFL) | Admitting: Obstetrics

## 2024-02-08 ENCOUNTER — Encounter: Payer: Self-pay | Admitting: Obstetrics

## 2024-02-08 VITALS — Ht 64.0 in | Wt 200.7 lb

## 2024-02-08 DIAGNOSIS — N950 Postmenopausal bleeding: Secondary | ICD-10-CM

## 2024-02-08 DIAGNOSIS — L292 Pruritus vulvae: Secondary | ICD-10-CM | POA: Diagnosis not present

## 2024-02-08 MED ORDER — CLOBETASOL PROPIONATE 0.05 % EX OINT: 1.0000 | TOPICAL_OINTMENT | Freq: Two times a day (BID) | CUTANEOUS | 1 refills | Status: AC

## 2024-02-08 NOTE — Progress Notes (Signed)
    GYNECOLOGY PROGRESS NOTE  Subjective:  PCP: Dortha Kern, MD  Patient ID: Brianna [name], female    DOB: 08-31-1946, 78 y.o.   MRN: [id number]  HPI  Patient is a 78 y.o. G62P1001 female who presents for vaginal itching for 2 years and some vaginal spotting for 2 weeks.  PMB:  Light spotting for the past two weeks. This is second episode, saw Dr. Bjorn Pippin in 2022 and [place] reassuring with EMT 2mm, EMB not performed.   Vulvar Itching:  Off and on for past 2 yrs, external itching will come and go every several days. Longest period of relief in the past 2 years about 2 weeks. OTC Monistat helps slightly. Nothing exacerbates that she can identify.  The following portions of the patient's history were reviewed and updated as appropriate: allergies, current medications, past family history, past medical history, past social history, past surgical history, and problem list.  Review of Systems Pertinent items are noted in HPI.   Objective:   Height 5\' 4"  (1.626 m), weight 200 lb 11.2 oz (91 kg). Body mass index is 34.45 kg/m.  Physical Exam Vitals and nursing note reviewed. Exam conducted with a chaperone present.  Constitutional:      Appearance: Normal appearance.  HENT:     Head: Normocephalic and atraumatic.  Eyes:     Extraocular Movements: Extraocular movements intact.  Pulmonary:     Effort: Pulmonary effort is normal.  Genitourinary:    Pubic Area: No rash.      Labia:        Right: No rash, tenderness or lesion.        Left: No rash, tenderness or lesion.      Vagina: Normal. No vaginal discharge, tenderness or lesions.     Cervix: Normal. No discharge or lesion.     Uterus: Normal.      Comments: Mildly atrophic vulvovaginal mucosa Neurological:     Mental Status: She is alert.    Assessment/Plan:   1. Postmenopausal bleeding   2. Vulvar pruritus     PMB:  -Will obtain pelvic US and if reassuring, will defer EMB  Vulvar pruritus: -Discussed atrophic  source vs inflammatory; pt desiring to avoid hormonal treatment -Trial Clotrimazole ointment BID and follow up in 1 month -If no improvement, consider biopsy   Brianna Manson, DO [facility]

## 2024-02-14 ENCOUNTER — Telehealth: Payer: Self-pay

## 2024-02-14 NOTE — Telephone Encounter (Signed)
Patient calling in today with concerns regarding her ultrasound tomorrow. She states during her last ultrasound she was sedated and during her last exam she experienced pain. Patient wanting to know if their is an option for sedation or medication prior to transvaginal ultrasound tomorrow. Please advise.

## 2024-02-15 ENCOUNTER — Ambulatory Visit
Admission: RE | Admit: 2024-02-15 | Discharge: 2024-02-15 | Disposition: A | Discharge status: Home / Self Care | Payer: PPO | Source: Ambulatory Visit | Attending: Obstetrics | Admitting: Obstetrics

## 2024-02-15 DIAGNOSIS — N950 Postmenopausal bleeding: Secondary | ICD-10-CM | POA: Diagnosis present

## 2024-02-16 NOTE — Telephone Encounter (Signed)
Closing, pt already had [place] by time message viewed.

## 2024-03-07 ENCOUNTER — Other Ambulatory Visit (HOSPITAL_COMMUNITY)
Admission: RE | Admit: 2024-03-07 | Discharge: 2024-03-07 | Disposition: A | Discharge status: Home / Self Care | Source: Ambulatory Visit | Attending: Obstetrics | Admitting: Obstetrics

## 2024-03-07 ENCOUNTER — Encounter: Payer: Self-pay | Admitting: Obstetrics

## 2024-03-07 ENCOUNTER — Ambulatory Visit (INDEPENDENT_AMBULATORY_CARE_PROVIDER_SITE_OTHER): Payer: TRICARE For Life (TFL) | Admitting: Obstetrics

## 2024-03-07 VITALS — BP 143/73 | HR 57 | Ht 64.0 in | Wt 198.0 lb

## 2024-03-07 DIAGNOSIS — R9389 Abnormal findings on diagnostic imaging of other specified body structures: Secondary | ICD-10-CM | POA: Insufficient documentation

## 2024-03-07 DIAGNOSIS — N858 Other specified noninflammatory disorders of uterus: Secondary | ICD-10-CM

## 2024-03-07 DIAGNOSIS — N950 Postmenopausal bleeding: Secondary | ICD-10-CM

## 2024-03-07 NOTE — Progress Notes (Signed)
    GYNECOLOGY PROGRESS NOTE  Subjective:  PCP: Dortha Kern, MD  Patient ID: [place] Crane, female    DOB: 1946/10/01, 78 y.o.   MRN: [id number]  HPI  Patient is a 78 y.o. G52P1001 female who presents for follow up on [place] for PMB. She was seen 02/08/24 and had 2 weeks of spotting. US done 02/15/24. EMT 1.0cm and was "normal, homogeneous." She reports no more bleeding. Was also following up on vulvar pruritus, used the Clobetasol and symptoms have resolved.   The following portions of the patient's history were reviewed and updated as appropriate: allergies, current medications, past family history, past medical history, past social history, past surgical history, and problem list.  Review of Systems Pertinent items are noted in HPI.   Objective:   Blood pressure (!) 143/73, pulse (!) 57, height 5\' 4"  (1.626 m), weight 198 lb (89.8 kg). Body mass index is 33.99 kg/m.  General appearance: alert, cooperative, and moderately obese Abdomen: soft, non-tender; bowel sounds normal; no masses,  no organomegaly Pelvic: cervix normal in appearance, external genitalia normal, no adnexal masses or tenderness, no cervical motion tenderness, uterus normal size, shape, and consistency, and vagina normal without discharge Extremities: extremities normal, atraumatic, no cyanosis or edema Neurologic: Grossly normal  PROCEDURE: US PELVIS COMPLETE WITH TRANSVAGINAL   HISTORY: Patient is a 78 y/o  F with postmenopausal bleeding.   COMPARISON: U/S pelvis 11/07/2021.   TECHNIQUE: Two-dimensional transabdominal grayscale and color Doppler ultrasound of the pelvis was performed. Transvaginal was also performed.   FINDINGS: The uterus is anteverted in position and measures 7.2 x 4.4 x 3.5 cm. It demonstrates a heterogeneous echotexture without definite fibroid. The endometrium measures 1.0 cm and demonstrates a normal homogeneous echotexture. Multiple nabothian cysts are noted.   The ovaries are not  visualized likely due to patient's age/postmenopausal state.   There is no fluid present within the cul-de-sac.   IMPRESSION: 1. Heterogeneous echotexture without definite fibroid. Thickened endometrium measuring up to 1.0 cm. Gynecological consultation and tissue sampling may be recommended for further evaluation as clinically indicated.   2.  Nonvisualization of the ovaries.  Endometrial Biopsy Procedure Note Informed consent obtained for thickened endometrium and PMB.  The patient was positioned on the exam table in the dorsal lithotomy position. Bimanual exam confirmed uterine position and size. A Graves speculum was placed into the vagina. A single toothed tenaculum was placed onto the anterior lip of the cervix. The pipette was placed into the endocervical canal and advanced to the uterine fundus. Using a piston like technique, with vacuum created by withdrawing the stylus, the endometrial specimen was obtained and transferred to the biopsy container. Minimal bleeding encountered. The procedure was well tolerated.   Uterine Position: mid   Uterine Length: 7cm  Uterine Specimen: Scant  Assessment/Plan:   1. Thickened endometrium   2. Postmenopausal bleeding   EMT 1.0cm on [place], EMB done today.   Post-procedural instructions reviewed Will call with results   Julieanne Manson, DO [facility] [place]

## 2024-03-09 LAB — SURGICAL PATHOLOGY

## 2024-03-14 NOTE — Progress Notes (Signed)
Pt aware.

## 2024-07-27 ENCOUNTER — Other Ambulatory Visit: Payer: Self-pay | Admitting: Family Medicine

## 2024-07-27 DIAGNOSIS — M8580 Other specified disorders of bone density and structure, unspecified site: Secondary | ICD-10-CM

## 2024-07-27 DIAGNOSIS — Z780 Asymptomatic menopausal state: Secondary | ICD-10-CM
